# Patient Record
Sex: Female | Born: 1955 | Race: Black or African American | Marital: Single | Smoking: Never smoker
Health system: Southern US, Community
[De-identification: ages and names within clinical notes are randomized; demographics above are authoritative.]

## PROBLEM LIST (undated history)

## (undated) DIAGNOSIS — I10 Essential (primary) hypertension: Secondary | ICD-10-CM

## (undated) DIAGNOSIS — I639 Cerebral infarction, unspecified: Secondary | ICD-10-CM

---

## 2017-10-03 ENCOUNTER — Ambulatory Visit (INDEPENDENT_AMBULATORY_CARE_PROVIDER_SITE_OTHER): Payer: BLUE CROSS/BLUE SHIELD | Admitting: Advanced Practice Midwife

## 2017-10-03 ENCOUNTER — Encounter: Payer: Self-pay | Admitting: Advanced Practice Midwife

## 2017-10-03 VITALS — BP 135/97 | HR 74 | Ht 62.0 in | Wt 124.1 lb

## 2017-10-03 DIAGNOSIS — L918 Other hypertrophic disorders of the skin: Secondary | ICD-10-CM

## 2017-10-03 DIAGNOSIS — Z01419 Encounter for gynecological examination (general) (routine) without abnormal findings: Secondary | ICD-10-CM

## 2017-10-03 DIAGNOSIS — Z780 Asymptomatic menopausal state: Secondary | ICD-10-CM | POA: Insufficient documentation

## 2017-10-03 DIAGNOSIS — N951 Menopausal and female climacteric states: Secondary | ICD-10-CM

## 2017-10-03 NOTE — Progress Notes (Signed)
GYNECOLOGY ANNUAL PREVENTATIVE CARE ENCOUNTER NOTE  Subjective:   Ann Schmidt is a 62 y.o. [id number] female here for a routine annual gynecologic exam.  Current complaints: none.  Wants physical exam form completed so she can foster to adopt.  Plans to adopt preteen brothers. Is healthy and does weight bearing exercise to keep bones strong.  Has some night sweats still.   No daytime hot flashes.  Unmarried and abstinent.  Happy with her life.  Works as an Acupuncturistelectrical engineer.  .   Denies abnormal vaginal bleeding, discharge, pelvic pain, or other gynecologic concerns.    Gynecologic History No LMP recorded. Contraception: menopausal Last Pap: 2018. Results were: normal Last mammogram: last year. Results were: normal Had colonoscopy 8 years ago  Obstetric History OB History  Gravida Para Term Preterm AB Living  3       3    SAB TAB Ectopic Multiple Live Births    3          # Outcome Date GA Lbr Len/2nd Weight Sex Delivery Anes PTL Lv  3 TAB 1985          2 TAB 1982          1 TAB 1980            History reviewed. No pertinent past medical history.  History reviewed. No pertinent surgical history.  Current Outpatient Medications on File Prior to Visit  Medication Sig Dispense Refill  . amoxicillin (AMOXIL) 500 MG capsule Take by mouth.     No current facility-administered medications on file prior to visit.     Allergies  Allergen Reactions  . Sulfa Antibiotics Hives  . Sulfamethoxazole-Trimethoprim Hives    Social History   Socioeconomic History  . Marital status: Single    Spouse name: Not on file  . Number of children: Not on file  . Years of education: Not on file  . Highest education level: Not on file  Occupational History  . Not on file  Social Needs  . Financial resource strain: Not on file  . Food insecurity:    Worry: Not on file    Inability: Not on file  . Transportation needs:    Medical: Not on file    Non-medical: Not on file  Tobacco Use  .  Smoking status: Never Smoker  . Smokeless tobacco: Never Used  Substance and Sexual Activity  . Alcohol use: Yes    Alcohol/week: 1.2 oz    Types: 2 Glasses of wine per week  . Drug use: Never  . Sexual activity: Not Currently    Birth control/protection: None  Lifestyle  . Physical activity:    Days per week: Not on file    Minutes per session: Not on file  . Stress: Not on file  Relationships  . Social connections:    Talks on phone: Not on file    Gets together: Not on file    Attends religious service: Not on file    Active member of club or organization: Not on file    Attends meetings of clubs or organizations: Not on file    Relationship status: Not on file  . Intimate partner violence:    Fear of current or ex partner: Not on file    Emotionally abused: Not on file    Physically abused: Not on file    Forced sexual activity: Not on file  Other Topics Concern  . Not on file  Social History Narrative  .  Not on file    Family History  Problem Relation Age of Onset  . Diabetes Mother     The following portions of the patient's history were reviewed and updated as appropriate: allergies, current medications, past family history, past medical history, past social history, past surgical history and problem list.  Review of Systems Pertinent items are noted in HPI.  Has small skin tags on neck   Objective:  BP (!) 135/97   Pulse 74   Ht 5\' 2"  (1.575 m)   Wt 124 lb 1.3 oz (56.3 kg)   BMI 22.69 kg/m  CONSTITUTIONAL: Well-developed, well-nourished female in no acute distress.  HENT:  Normocephalic, atraumatic, External right and left ear normal. Oropharynx is clear and moist  Tiny skin tags on neck EYES: Conjunctivae and EOM are normal.  No scleral icterus.  NECK: Normal range of motion, supple, no masses.  Normal thyroid.  SKIN: Skin is warm and dry. No rash noted. Not diaphoretic. No erythema. No pallor. NEUROLOGIC: Alert and oriented to person, place, and time.  Normal reflexes, muscle tone coordination. No cranial nerve deficit noted. PSYCHIATRIC: Normal mood and affect. Normal behavior. Normal judgment and thought content. CARDIOVASCULAR: Normal heart rate noted, regular rhythm RESPIRATORY: Clear to auscultation bilaterally. Effort and breath sounds normal, no problems with respiration noted. BREASTS: Symmetric in size. No masses, skin changes, nipple drainage, or lymphadenopathy. ABDOMEN: Soft, normal bowel sounds, no distention noted.  No tenderness, rebound or guarding.  PELVIC: Normal appearing external genitalia; normal appearing vaginal mucosa and cervix.  No abnormal discharge noted.  Pap smear not obtained, but I did use speculum to examine internal vagina and cervix.  Normal uterine size, no other palpable masses, no uterine or adnexal tenderness.    MUSCULOSKELETAL: Normal range of motion. No tenderness.  No cyanosis, clubbing, or edema.  2+ distal pulses.   Assessment:  Annual gynecologic examination without pap smear Menopausal with only night sweats Normal exam findings   Plan:  Mammogram not scheduled, patient will schedule Routine preventative health maintenance measures emphasized. Please refer to After Visit Summary for other counseling recommendations.

## 2017-10-03 NOTE — Patient Instructions (Signed)
Health Maintenance for Postmenopausal Women Menopause is a normal process in which your reproductive ability comes to an end. This process happens gradually over a span of months to years, usually between the ages of 22 and 9. Menopause is complete when you have missed 12 consecutive menstrual periods. It is important to talk with your health care provider about some of the most common conditions that affect postmenopausal women, such as heart disease, cancer, and bone loss (osteoporosis). Adopting a healthy lifestyle and getting preventive care can help to promote your health and wellness. Those actions can also lower your chances of developing some of these common conditions. What should I know about menopause? During menopause, you may experience a number of symptoms, such as:  Moderate-to-severe hot flashes.  Night sweats.  Decrease in sex drive.  Mood swings.  Headaches.  Tiredness.  Irritability.  Memory problems.  Insomnia.  Choosing to treat or not to treat menopausal changes is an individual decision that you make with your health care provider. What should I know about hormone replacement therapy and supplements? Hormone therapy products are effective for treating symptoms that are associated with menopause, such as hot flashes and night sweats. Hormone replacement carries certain risks, especially as you become older. If you are thinking about using estrogen or estrogen with progestin treatments, discuss the benefits and risks with your health care provider. What should I know about heart disease and stroke? Heart disease, heart attack, and stroke become more likely as you age. This may be due, in part, to the hormonal changes that your body experiences during menopause. These can affect how your body processes dietary fats, triglycerides, and cholesterol. Heart attack and stroke are both medical emergencies. There are many things that you can do to help prevent heart disease  and stroke:  Have your blood pressure checked at least every 1-2 years. High blood pressure causes heart disease and increases the risk of stroke.  If you are 53-22 years old, ask your health care provider if you should take aspirin to prevent a heart attack or a stroke.  Do not use any tobacco products, including cigarettes, chewing tobacco, or electronic cigarettes. If you need help quitting, ask your health care provider.  It is important to eat a healthy diet and maintain a healthy weight. ? Be sure to include plenty of vegetables, fruits, low-fat dairy products, and lean protein. ? Avoid eating foods that are high in solid fats, added sugars, or salt (sodium).  Get regular exercise. This is one of the most important things that you can do for your health. ? Try to exercise for at least 150 minutes each week. The type of exercise that you do should increase your heart rate and make you sweat. This is known as moderate-intensity exercise. ? Try to do strengthening exercises at least twice each week. Do these in addition to the moderate-intensity exercise.  Know your numbers.Ask your health care provider to check your cholesterol and your blood glucose. Continue to have your blood tested as directed by your health care provider.  What should I know about cancer screening? There are several types of cancer. Take the following steps to reduce your risk and to catch any cancer development as early as possible. Breast Cancer  Practice breast self-awareness. ? This means understanding how your breasts normally appear and feel. ? It also means doing regular breast self-exams. Let your health care provider know about any changes, no matter how small.  If you are 40  or older, have a clinician do a breast exam (clinical breast exam or CBE) every year. Depending on your age, family history, and medical history, it may be recommended that you also have a yearly breast X-ray (mammogram).  If you  have a family history of breast cancer, talk with your health care provider about genetic screening.  If you are at high risk for breast cancer, talk with your health care provider about having an MRI and a mammogram every year.  Breast cancer (BRCA) gene test is recommended for women who have family members with BRCA-related cancers. Results of the assessment will determine the need for genetic counseling and BRCA1 and for BRCA2 testing. BRCA-related cancers include these types: ? Breast. This occurs in males or females. ? Ovarian. ? Tubal. This may also be called fallopian tube cancer. ? Cancer of the abdominal or pelvic lining (peritoneal cancer). ? Prostate. ? Pancreatic.  Cervical, Uterine, and Ovarian Cancer Your health care provider may recommend that you be screened regularly for cancer of the pelvic organs. These include your ovaries, uterus, and vagina. This screening involves a pelvic exam, which includes checking for microscopic changes to the surface of your cervix (Pap test).  For women ages 21-65, health care providers may recommend a pelvic exam and a Pap test every three years. For women ages 79-65, they may recommend the Pap test and pelvic exam, combined with testing for human papilloma virus (HPV), every five years. Some types of HPV increase your risk of cervical cancer. Testing for HPV may also be done on women of any age who have unclear Pap test results.  Other health care providers may not recommend any screening for nonpregnant women who are considered low risk for pelvic cancer and have no symptoms. Ask your health care provider if a screening pelvic exam is right for you.  If you have had past treatment for cervical cancer or a condition that could lead to cancer, you need Pap tests and screening for cancer for at least 20 years after your treatment. If Pap tests have been discontinued for you, your risk factors (such as having a new sexual partner) need to be  reassessed to determine if you should start having screenings again. Some women have medical problems that increase the chance of getting cervical cancer. In these cases, your health care provider may recommend that you have screening and Pap tests more often.  If you have a family history of uterine cancer or ovarian cancer, talk with your health care provider about genetic screening.  If you have vaginal bleeding after reaching menopause, tell your health care provider.  There are currently no reliable tests available to screen for ovarian cancer.  Lung Cancer Lung cancer screening is recommended for adults 69-62 years old who are at high risk for lung cancer because of a history of smoking. A yearly low-dose CT scan of the lungs is recommended if you:  Currently smoke.  Have a history of at least 30 pack-years of smoking and you currently smoke or have quit within the past 15 years. A pack-year is smoking an average of one pack of cigarettes per day for one year.  Yearly screening should:  Continue until it has been 15 years since you quit.  Stop if you develop a health problem that would prevent you from having lung cancer treatment.  Colorectal Cancer  This type of cancer can be detected and can often be prevented.  Routine colorectal cancer screening usually begins at  age 42 and continues through age 45.  If you have risk factors for colon cancer, your health care provider may recommend that you be screened at an earlier age.  If you have a family history of colorectal cancer, talk with your health care provider about genetic screening.  Your health care provider may also recommend using home test kits to check for hidden blood in your stool.  A small camera at the end of a tube can be used to examine your colon directly (sigmoidoscopy or colonoscopy). This is done to check for the earliest forms of colorectal cancer.  Direct examination of the colon should be repeated every  5-10 years until age 71. However, if early forms of precancerous polyps or small growths are found or if you have a family history or genetic risk for colorectal cancer, you may need to be screened more often.  Skin Cancer  Check your skin from head to toe regularly.  Monitor any moles. Be sure to tell your health care provider: ? About any new moles or changes in moles, especially if there is a change in a mole's shape or color. ? If you have a mole that is larger than the size of a pencil eraser.  If any of your family members has a history of skin cancer, especially at a young age, talk with your health care provider about genetic screening.  Always use sunscreen. Apply sunscreen liberally and repeatedly throughout the day.  Whenever you are outside, protect yourself by wearing long sleeves, pants, a wide-brimmed hat, and sunglasses.  What should I know about osteoporosis? Osteoporosis is a condition in which bone destruction happens more quickly than new bone creation. After menopause, you may be at an increased risk for osteoporosis. To help prevent osteoporosis or the bone fractures that can happen because of osteoporosis, the following is recommended:  If you are 46-71 years old, get at least 1,000 mg of calcium and at least 600 mg of vitamin D per day.  If you are older than age 55 but younger than age 65, get at least 1,200 mg of calcium and at least 600 mg of vitamin D per day.  If you are older than age 54, get at least 1,200 mg of calcium and at least 800 mg of vitamin D per day.  Smoking and excessive alcohol intake increase the risk of osteoporosis. Eat foods that are rich in calcium and vitamin D, and do weight-bearing exercises several times each week as directed by your health care provider. What should I know about how menopause affects my mental health? Depression may occur at any age, but it is more common as you become older. Common symptoms of depression  include:  Low or sad mood.  Changes in sleep patterns.  Changes in appetite or eating patterns.  Feeling an overall lack of motivation or enjoyment of activities that you previously enjoyed.  Frequent crying spells.  Talk with your health care provider if you think that you are experiencing depression. What should I know about immunizations? It is important that you get and maintain your immunizations. These include:  Tetanus, diphtheria, and pertussis (Tdap) booster vaccine.  Influenza every year before the flu season begins.  Pneumonia vaccine.  Shingles vaccine.  Your health care provider may also recommend other immunizations. This information is not intended to replace advice given to you by your health care provider. Make sure you discuss any questions you have with your health care provider. Document Released: 04/22/2005  Document Revised: 09/18/2015 Document Reviewed: 12/02/2014 Elsevier Interactive Patient Education  2018 [employer]  

## 2019-05-24 ENCOUNTER — Ambulatory Visit: Payer: BLUE CROSS/BLUE SHIELD | Admitting: Obstetrics & Gynecology

## 2019-07-01 ENCOUNTER — Ambulatory Visit: Payer: Self-pay | Admitting: Obstetrics & Gynecology

## 2019-10-16 ENCOUNTER — Ambulatory Visit (INDEPENDENT_AMBULATORY_CARE_PROVIDER_SITE_OTHER): Payer: 59 | Admitting: Obstetrics & Gynecology

## 2019-10-16 ENCOUNTER — Encounter: Payer: Self-pay | Admitting: Obstetrics & Gynecology

## 2019-10-16 ENCOUNTER — Other Ambulatory Visit: Payer: Self-pay

## 2019-10-16 VITALS — BP 149/108 | HR 91 | Ht 62.0 in | Wt 121.0 lb

## 2019-10-16 DIAGNOSIS — Z1231 Encounter for screening mammogram for malignant neoplasm of breast: Secondary | ICD-10-CM

## 2019-10-16 DIAGNOSIS — Z01419 Encounter for gynecological examination (general) (routine) without abnormal findings: Secondary | ICD-10-CM | POA: Diagnosis not present

## 2019-10-16 DIAGNOSIS — B354 Tinea corporis: Secondary | ICD-10-CM | POA: Diagnosis not present

## 2019-10-16 DIAGNOSIS — Z538 Procedure and treatment not carried out for other reasons: Secondary | ICD-10-CM | POA: Diagnosis not present

## 2019-10-16 MED ORDER — MICONAZOLE NITRATE 2 % EX CREA: 1.0000 "application " | TOPICAL_CREAM | Freq: Two times a day (BID) | CUTANEOUS | 3 refills | Status: DC

## 2019-10-16 NOTE — Progress Notes (Signed)
Subjective:     Ann Schmidt is a 64 y.o. female here for a routine exam.  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 77 years old. Does not want PAP. Colonoscopy 2018 in [place].     Gynecologic History No LMP recorded. Patient is postmenopausal. Contraception: abstinence Last Pap: >3 years prev. Declines Pap today Last mammogram: Pt does not get annual mammograms as she reports not having any breast issues.   Obstetric History OB History  Gravida Para Term Preterm AB Living  3       3    SAB TAB Ectopic Multiple Live Births    3          # Outcome Date GA Lbr Len/2nd Weight Sex Delivery Anes PTL Lv  3 TAB 1985          2 TAB 1982          1 TAB 1980           The following portions of the patient's history were reviewed and updated as appropriate: allergies, current medications, past family history, past medical history, past social history, past surgical history and problem list.  Review of Systems Pertinent items are noted in HPI.    Objective:  BP (!) 149/108    Pulse 91    Ht 5\' 2"  (1.575 m)    Wt 121 lb (54.9 kg)    BMI 22.13 kg/m  General Appearance:    Alert, cooperative, no distress, appears stated age  Head:    Normocephalic, without obvious abnormality, atraumatic  Eyes:    conjunctiva/corneas clear, EOM's intact, both eyes  Ears:    Normal external ear canals, both ears  Nose:   Nares normal, septum midline, mucosa normal, no drainage    or sinus tenderness  Throat:   Lips, mucosa, and tongue normal; teeth and gums normal  Neck:   Supple, symmetrical, trachea midline, no adenopathy;    thyroid:  no enlargement/tenderness/nodules  Back:     Symmetric, no curvature, ROM normal, no CVA tenderness  Lungs:     respirations unlabored  Chest Wall:    No tenderness or deformity   Heart:    Regular rate and rhythm  Breast Exam:    No tenderness, masses, or nipple abnormality; yeast noted under breast.    Abdomen:      Soft, non-tender, bowel sounds active all four quadrants,    no masses, no organomegaly  Genitalia:    Normal female without lesion, discharge or tenderness     Extremities:   Extremities normal, atraumatic, no cyanosis or edema  Pulses:   2+ and symmetric all extremities  Skin:   Skin color, texture, turgor normal, no rashes or lesions     Assessment:    Healthy female exam.   Tinea under breasts.  Pt declines PAP. Agreed to get mammogram after counseling.   Plan:     Ann Schmidt was seen today for gynecologic exam.  Diagnoses and all orders for this visit:  Mammogram not needed  Breast cancer screening by mammogram -     MM DIGITAL SCREENING BILATERAL; Future  Tinea corporis -     miconazole (MICATIN) 2 % cream; Apply 1 application topically 2 (two) times daily.  f/u in 1 year or sooner prn  Ann Schmidt, M.D., [facility]

## 2019-11-30 ENCOUNTER — Emergency Department (HOSPITAL_BASED_OUTPATIENT_CLINIC_OR_DEPARTMENT_OTHER): Payer: 59

## 2019-11-30 ENCOUNTER — Inpatient Hospital Stay (HOSPITAL_BASED_OUTPATIENT_CLINIC_OR_DEPARTMENT_OTHER)
Admission: EM | Admit: 2019-11-30 | Discharge: 2019-12-02 | DRG: 065 | Disposition: A | Discharge status: Home Health | Payer: 59 | Attending: Internal Medicine | Admitting: Internal Medicine

## 2019-11-30 ENCOUNTER — Other Ambulatory Visit: Payer: Self-pay

## 2019-11-30 ENCOUNTER — Encounter (HOSPITAL_BASED_OUTPATIENT_CLINIC_OR_DEPARTMENT_OTHER): Payer: Self-pay | Admitting: Emergency Medicine

## 2019-11-30 DIAGNOSIS — Z882 Allergy status to sulfonamides status: Secondary | ICD-10-CM

## 2019-11-30 DIAGNOSIS — R29701 NIHSS score 1: Secondary | ICD-10-CM | POA: Diagnosis present

## 2019-11-30 DIAGNOSIS — R2990 Unspecified symptoms and signs involving the nervous system: Secondary | ICD-10-CM | POA: Insufficient documentation

## 2019-11-30 DIAGNOSIS — R200 Anesthesia of skin: Secondary | ICD-10-CM | POA: Diagnosis not present

## 2019-11-30 DIAGNOSIS — E785 Hyperlipidemia, unspecified: Secondary | ICD-10-CM | POA: Diagnosis present

## 2019-11-30 DIAGNOSIS — R202 Paresthesia of skin: Secondary | ICD-10-CM

## 2019-11-30 DIAGNOSIS — Z833 Family history of diabetes mellitus: Secondary | ICD-10-CM

## 2019-11-30 DIAGNOSIS — G8191 Hemiplegia, unspecified affecting right dominant side: Secondary | ICD-10-CM | POA: Diagnosis present

## 2019-11-30 DIAGNOSIS — I639 Cerebral infarction, unspecified: Secondary | ICD-10-CM

## 2019-11-30 DIAGNOSIS — Z20822 Contact with and (suspected) exposure to covid-19: Secondary | ICD-10-CM | POA: Diagnosis present

## 2019-11-30 DIAGNOSIS — I6381 Other cerebral infarction due to occlusion or stenosis of small artery: Principal | ICD-10-CM | POA: Diagnosis present

## 2019-11-30 DIAGNOSIS — I10 Essential (primary) hypertension: Secondary | ICD-10-CM

## 2019-11-30 DIAGNOSIS — R7303 Prediabetes: Secondary | ICD-10-CM | POA: Diagnosis present

## 2019-11-30 LAB — URINALYSIS, ROUTINE W REFLEX MICROSCOPIC
Bilirubin Urine: NEGATIVE
Glucose, UA: NEGATIVE mg/dL
Hgb urine dipstick: NEGATIVE
Ketones, ur: NEGATIVE mg/dL
Nitrite: NEGATIVE
Protein, ur: NEGATIVE mg/dL
Specific Gravity, Urine: <1.005 — ABNORMAL LOW (ref 1.005–1.030)
pH: 7 (ref 5.0–8.0)

## 2019-11-30 LAB — URINALYSIS, MICROSCOPIC (REFLEX)

## 2019-11-30 LAB — COMPREHENSIVE METABOLIC PANEL
ALT: 22 U/L (ref 0–44)
AST: 26 U/L (ref 15–41)
Albumin: 4.2 g/dL (ref 3.5–5.0)
Alkaline Phosphatase: 61 U/L (ref 38–126)
Anion gap: 12 (ref 5–15)
BUN: 18 mg/dL (ref 8–23)
CO2: 25 mmol/L (ref 22–32)
Calcium: 10.3 mg/dL (ref 8.9–10.3)
Chloride: 100 mmol/L (ref 98–111)
Creatinine, Ser: 0.95 mg/dL (ref 0.44–1.00)
GFR calc Af Amer: >60 mL/min (ref >60)
GFR calc non Af Amer: >60 mL/min (ref >60)
Glucose, Bld: 171 mg/dL — ABNORMAL HIGH (ref 70–99)
Potassium: 3.5 mmol/L (ref 3.5–5.1)
Sodium: 137 mmol/L (ref 135–145)
Total Bilirubin: 0.6 mg/dL (ref 0.3–1.2)
Total Protein: 7.5 g/dL (ref 6.5–8.1)

## 2019-11-30 LAB — RAPID URINE DRUG SCREEN, HOSP PERFORMED
Amphetamines: NOT DETECTED
Barbiturates: NOT DETECTED
Benzodiazepines: NOT DETECTED
Cocaine: NOT DETECTED
Opiates: NOT DETECTED
Tetrahydrocannabinol: NOT DETECTED

## 2019-11-30 LAB — CBC
HCT: 41.6 % (ref 36.0–46.0)
Hemoglobin: 14 g/dL (ref 12.0–15.0)
MCH: 30.8 pg (ref 26.0–34.0)
MCHC: 33.7 g/dL (ref 30.0–36.0)
MCV: 91.4 fL (ref 80.0–100.0)
Platelets: 205 10*3/uL (ref 150–400)
RBC: 4.55 MIL/uL (ref 3.87–5.11)
RDW: 12.7 % (ref 11.5–15.5)
WBC: 6.3 10*3/uL (ref 4.0–10.5)
nRBC: 0 % (ref 0.0–0.2)

## 2019-11-30 LAB — DIFFERENTIAL
Abs Immature Granulocytes: 0.02 10*3/uL (ref 0.00–0.07)
Basophils Absolute: 0 10*3/uL (ref 0.0–0.1)
Basophils Relative: 1 %
Eosinophils Absolute: 0.1 10*3/uL (ref 0.0–0.5)
Eosinophils Relative: 1 %
Immature Granulocytes: 0 %
Lymphocytes Relative: 39 %
Lymphs Abs: 2.5 10*3/uL (ref 0.7–4.0)
Monocytes Absolute: 0.6 10*3/uL (ref 0.1–1.0)
Monocytes Relative: 9 %
Neutro Abs: 3.1 10*3/uL (ref 1.7–7.7)
Neutrophils Relative %: 50 %

## 2019-11-30 LAB — ETHANOL: Alcohol, Ethyl (B): <10 mg/dL (ref <10)

## 2019-11-30 LAB — CBG MONITORING, ED: Glucose-Capillary: 156 mg/dL — ABNORMAL HIGH (ref 70–99)

## 2019-11-30 LAB — PROTIME-INR
INR: 1 (ref 0.8–1.2)
Prothrombin Time: 12.5 seconds (ref 11.4–15.2)

## 2019-11-30 LAB — APTT: aPTT: 31 seconds (ref 24–36)

## 2019-11-30 LAB — SARS CORONAVIRUS 2 BY RT PCR (HOSPITAL ORDER, PERFORMED IN ~~LOC~~ HOSPITAL LAB): SARS Coronavirus 2: NEGATIVE

## 2019-11-30 NOTE — ED Notes (Signed)
ED Provider at bedside. 

## 2019-11-30 NOTE — ED Triage Notes (Signed)
R arm and leg numbness since 18:30 yesterday.

## 2019-11-30 NOTE — ED Provider Notes (Signed)
Emergency Department Provider Note   I have reviewed the triage vital signs and the nursing notes.   HISTORY  Chief Complaint Numbness   HPI Ann Schmidt is a 64 y.o. female with PMH reviewed below presents to the emergency department with right arm and leg numbness with weakness.  Symptoms began at 18:30 yesterday.  Patient states she initially developed numbness feeling in the right foot but then noticed that it spread to her right arm and leg.  She denies any weakness or numbness in the face.  She is not experiencing headache.  No vision changes.  No speech changes.  No difficulty swallowing.  She states it was getting late yesterday and she did not want to come to the emergency department but this morning when symptoms persisted she decided to f/u in the ED and presents to the POV.   History reviewed. No pertinent past medical history.  Patient Active Problem List   Diagnosis Date Noted  . Stroke-like symptoms 11/30/2019  . Menopausal hot flushes 10/03/2017  . Menopause 10/03/2017    History reviewed. No pertinent surgical history.  Allergies Sulfa antibiotics and Sulfamethoxazole-trimethoprim  Family History  Problem Relation Age of Onset  . Diabetes Mother     Social History Social History   Tobacco Use  . Smoking status: Never Smoker  . Smokeless tobacco: Never Used  Substance Use Topics  . Alcohol use: Yes    Alcohol/week: 2.0 standard drinks    Types: 2 Glasses of wine per week  . Drug use: Never    Review of Systems  Constitutional: No fever/chills Eyes: No visual changes. ENT: No sore throat. Cardiovascular: Denies chest pain. Respiratory: Denies shortness of breath. Gastrointestinal: No abdominal pain.  No nausea, no vomiting.  No diarrhea.  No constipation. Genitourinary: Negative for dysuria. Musculoskeletal: Negative for back pain. Skin: Negative for rash. Neurological: Negative for headaches. Positive right arm/leg weakness and numbness.    10-point ROS otherwise negative.  ____________________________________________   PHYSICAL EXAM:  VITAL SIGNS: ED Triage Vitals [11/30/19 1815]  Enc Vitals Group     BP (!) 196/144     Pulse Rate (!) 136     Resp 20     Temp 99.8 F (37.7 C)     Temp Source Oral     SpO2 99 %   Constitutional: Alert and oriented. Well appearing and in no acute distress. Eyes: Conjunctivae are normal. Head: Atraumatic. Nose: No congestion/rhinnorhea. Mouth/Throat: Mucous membranes are moist.  Oropharynx non-erythematous. Neck: No stridor.  Cardiovascular: Sinus tachcyardia. Good peripheral circulation. Grossly normal heart sounds.   Respiratory: Normal respiratory effort.  No retractions. Lungs CTAB. Gastrointestinal: Soft and nontender. No distention.  Musculoskeletal: No lower extremity tenderness nor edema. No gross deformities of extremities. Neurologic:  Normal speech and language. No numbness/weakness in the right face. 4+/5 strength in the right arm and leg with decreased sensation to light touch in the right arm/leg as well.  Skin:  Skin is warm, dry and intact. No rash noted.   ____________________________________________   LABS (all labs ordered are listed, but only abnormal results are displayed)  Labs Reviewed  COMPREHENSIVE METABOLIC PANEL - Abnormal; Notable for the following components:      Result Value   Glucose, Bld 171 (*)    All other components within normal limits  URINALYSIS, ROUTINE W REFLEX MICROSCOPIC - Abnormal; Notable for the following components:   Specific Gravity, Urine <1.005 (*)    Leukocytes,Ua SMALL (*)    All other  components within normal limits  URINALYSIS, MICROSCOPIC (REFLEX) - Abnormal; Notable for the following components:   Bacteria, UA FEW (*)    All other components within normal limits  CBG MONITORING, ED - Abnormal; Notable for the following components:   Glucose-Capillary 156 (*)    All other components within normal limits  SARS  CORONAVIRUS 2 BY RT PCR (HOSPITAL ORDER, PERFORMED IN [facility])  ETHANOL  PROTIME-INR  APTT  CBC  DIFFERENTIAL  RAPID URINE DRUG SCREEN, HOSP PERFORMED   ____________________________________________  EKG   EKG Interpretation  Date/Time:  Saturday November 30 2019 18:33:20 EDT Ventricular Rate:  135 PR Interval:    QRS Duration: 82 QT Interval:  291 QTC Calculation: 437 R Axis:   -21 Text Interpretation: Sinus tachycardia LAE, consider biatrial enlargement Borderline left axis deviation Low voltage, precordial leads No STEMI Confirmed by Alona Bene [phone]) on 11/30/2019 6:49:53 PM       ____________________________________________  RADIOLOGY  CT Head Wo Contrast  Result Date: 11/30/2019 CLINICAL DATA:  Right-sided numbness since 07/30 last night EXAM: CT HEAD WITHOUT CONTRAST TECHNIQUE: Contiguous axial images were obtained from the base of the skull through the vertex without intravenous contrast. COMPARISON:  None. FINDINGS: Brain: No evidence of acute infarction, hemorrhage, hydrocephalus, extra-axial collection or mass lesion/mass effect. Mild periventricular and deep white matter hypodensity. Vascular: No hyperdense vessel or unexpected calcification. Skull: Normal. Negative for fracture or focal lesion. Sinuses/Orbits: No acute finding. Other: None. IMPRESSION: 1. No acute intracranial pathology. No non-contrast CT evidence of acute stroke or hemorrhage. 2.  Small-vessel white matter disease. Electronically Signed   By: Lauralyn Primes M.D.   On: 11/30/2019 19:10    ____________________________________________   PROCEDURES  Procedure(s) performed:   Procedures  None ____________________________________________   INITIAL IMPRESSION / ASSESSMENT AND PLAN / ED COURSE  Pertinent labs & imaging results that were available during my care of the patient were reviewed by me and considered in my medical decision making (see chart for details).   Patient  presents to the emergency department with strokelike symptoms since yesterday.  She is greater than 24 hours with symptoms and outside the window for TPA or intravascular retrieval.  No LVO concerns clinically.  Plan for CT Noncon labs.  She does have sinus tachycardia here of unclear significance.  Plan for monitoring and allow for permissive hypertension for now pending CT.   08:00 AM   spoke with Dr. Laurence Slate with Neurology.  He will see the patient in consult when they arrive to [facility].  He request that the hospitalist team make him aware when the patient has arrived.  CT imaging of the head with no acute findings.  Labs reviewed and plan discussed with patient who is in agreement.  Discussed patient's case with [facility] to request admission. Patient and family (if present) updated with plan. Care transferred to [facility] service.  I reviewed all nursing notes, vitals, pertinent old records, EKGs, labs, imaging (as available).  ____________________________________________  FINAL CLINICAL IMPRESSION(S) / ED DIAGNOSES  Final diagnoses:  Cerebrovascular accident (CVA), unspecified mechanism (HCC)     Note:  This document was prepared using Dragon voice recognition software and may include unintentional dictation errors.  Alona Bene, MD, [facility]    Indiya Izquierdo, Arlyss Repress, MD 12/01/19 1324

## 2019-11-30 NOTE — ED Notes (Signed)
Patient transported to CT 

## 2019-12-01 ENCOUNTER — Encounter (HOSPITAL_COMMUNITY): Payer: Self-pay | Admitting: Family Medicine

## 2019-12-01 ENCOUNTER — Inpatient Hospital Stay (HOSPITAL_COMMUNITY): Payer: 59

## 2019-12-01 DIAGNOSIS — I63332 Cerebral infarction due to thrombosis of left posterior cerebral artery: Secondary | ICD-10-CM | POA: Diagnosis not present

## 2019-12-01 DIAGNOSIS — I361 Nonrheumatic tricuspid (valve) insufficiency: Secondary | ICD-10-CM | POA: Diagnosis not present

## 2019-12-01 DIAGNOSIS — E785 Hyperlipidemia, unspecified: Secondary | ICD-10-CM | POA: Diagnosis present

## 2019-12-01 DIAGNOSIS — I10 Essential (primary) hypertension: Secondary | ICD-10-CM

## 2019-12-01 DIAGNOSIS — R202 Paresthesia of skin: Secondary | ICD-10-CM

## 2019-12-01 DIAGNOSIS — R200 Anesthesia of skin: Secondary | ICD-10-CM | POA: Diagnosis present

## 2019-12-01 DIAGNOSIS — Z833 Family history of diabetes mellitus: Secondary | ICD-10-CM | POA: Diagnosis not present

## 2019-12-01 DIAGNOSIS — I6381 Other cerebral infarction due to occlusion or stenosis of small artery: Secondary | ICD-10-CM | POA: Diagnosis present

## 2019-12-01 DIAGNOSIS — G8191 Hemiplegia, unspecified affecting right dominant side: Secondary | ICD-10-CM | POA: Diagnosis present

## 2019-12-01 DIAGNOSIS — R29701 NIHSS score 1: Secondary | ICD-10-CM | POA: Diagnosis present

## 2019-12-01 DIAGNOSIS — R7303 Prediabetes: Secondary | ICD-10-CM | POA: Diagnosis present

## 2019-12-01 DIAGNOSIS — I6389 Other cerebral infarction: Secondary | ICD-10-CM | POA: Diagnosis not present

## 2019-12-01 DIAGNOSIS — Z882 Allergy status to sulfonamides status: Secondary | ICD-10-CM | POA: Diagnosis not present

## 2019-12-01 DIAGNOSIS — I639 Cerebral infarction, unspecified: Secondary | ICD-10-CM | POA: Diagnosis not present

## 2019-12-01 DIAGNOSIS — Z20822 Contact with and (suspected) exposure to covid-19: Secondary | ICD-10-CM | POA: Diagnosis present

## 2019-12-01 MED ORDER — STROKE: EARLY STAGES OF RECOVERY BOOK
Freq: Once | Status: AC
  Filled 2019-12-01: qty 1

## 2019-12-01 MED ORDER — IRBESARTAN 150 MG PO TABS
150.0000 mg | ORAL_TABLET | Freq: Every day | ORAL | Status: DC
  Administered 2019-12-01: 150 mg via ORAL
  Filled 2019-12-01: qty 1

## 2019-12-01 MED ORDER — ACETAMINOPHEN 325 MG PO TABS: 650.0000 mg | ORAL_TABLET | ORAL | Status: DC | PRN

## 2019-12-01 MED ORDER — ASPIRIN 81 MG PO CHEW
324.0000 mg | CHEWABLE_TABLET | Freq: Once | ORAL | Status: AC
  Administered 2019-12-01: 324 mg via ORAL
  Filled 2019-12-01: qty 4

## 2019-12-01 MED ORDER — SODIUM CHLORIDE 0.9 % IV SOLN: INTRAVENOUS | Status: DC

## 2019-12-01 MED ORDER — ATORVASTATIN CALCIUM 80 MG PO TABS
80.0000 mg | ORAL_TABLET | Freq: Every day | ORAL | Status: DC
  Administered 2019-12-01: 80 mg via ORAL
  Filled 2019-12-01: qty 1

## 2019-12-01 MED ORDER — CLOPIDOGREL BISULFATE 75 MG PO TABS
75.0000 mg | ORAL_TABLET | Freq: Every day | ORAL | Status: DC
  Administered 2019-12-01 – 2019-12-02 (×2): 75 mg via ORAL
  Filled 2019-12-01 (×2): qty 1

## 2019-12-01 MED ORDER — ACETAMINOPHEN 160 MG/5ML PO SOLN: 650.0000 mg | ORAL | Status: DC | PRN

## 2019-12-01 MED ORDER — ACETAMINOPHEN 650 MG RE SUPP: 650.0000 mg | RECTAL | Status: DC | PRN

## 2019-12-01 MED ORDER — SENNOSIDES-DOCUSATE SODIUM 8.6-50 MG PO TABS: 1.0000 | ORAL_TABLET | Freq: Every evening | ORAL | Status: DC | PRN

## 2019-12-01 NOTE — Consult Note (Signed)
Requesting Physician: Dr. Rachael Darby    Chief Complaint:   History obtained from: Patient and Chart   HPI:                                                                                                                                       Ann Schmidt is a 64 y.o. female with no significant past medical history presents emergency department with symptoms of right-sided numbness in her right leg that began on Friday evening 6:30 PM.  She woke up on Saturday and noticed now she had weakness and numbness in her right arm as well as right leg with associated difficulty walking patient presented to the med [facility] emergency department and underwent CT head which showed no acute abnormality.  She was candidate for IV TPA she was outside the window.  Patient waited in the emergency department for greater than 24 hours waiting transfer to [facility] for further stroke evaluation.  On arrival patient feels she has improved with reduced numbness and improved strength. She states her blood pressure was high on physical and was supposed to follow-up with PCP, just recently made an appointment and yet to establish care.  Date last known well: 9 18-21 tPA Given: No outside TPA window NIHSS: 1 Baseline MRS 0  Past medical history -Hypertension  Family History  Problem Relation Age of Onset  . Diabetes Mother    Social History:  reports that she has never smoked. She has never used smokeless tobacco. She reports current alcohol use of about 2.0 standard drinks of alcohol per week. She reports that she does not use drugs.  Allergies:  Allergies  Allergen Reactions  . Sulfa Antibiotics Hives  . Sulfamethoxazole-Trimethoprim Hives    Medications:                                                                                                                        I reviewed home medications   ROS:  14 systems reviewed and negative except above    Examination:                                                                                                      General: Appears well-developed  Psych: Affect appropriate to situation Eyes: No scleral injection HENT: No OP obstrucion Head: Normocephalic.  Cardiovascular: Normal rate and regular rhythm.  Respiratory: Effort normal and breath sounds normal to anterior ascultation GI: Soft.  No distension. There is no tenderness.  Skin: WDI    Neurological Examination Mental Status: Alert, oriented, thought content appropriate.  Speech fluent without evidence of aphasia. Able to follow 3 step commands without difficulty. Cranial Nerves: II: Visual fields grossly normal,  III,IV, VI: ptosis not present, extra-ocular motions intact bilaterally, pupils equal, round, reactive to light and accommodation V,VII: smile symmetric, facial light touch sensation normal bilaterally VIII: hearing normal bilaterally IX,X: uvula rises symmetrically XI: bilateral shoulder shrug XII: midline tongue extension Motor: Right : Upper extremity   5/5    Left:     Upper extremity   5/5  Lower extremity   5/5     Lower extremity   5/5 Tone and bulk:normal tone throughout; no atrophy noted Sensory: Reduced sensation to light touch on the right arm and right leg Deep Tendon Reflexes:  symmetric throughout Plantars: Right: downgoing   Left: downgoing Cerebellar: normal finger-to-nose, normal rapid alternating movements and normal heel-to-shin test    Lab Results: Basic Metabolic Panel: Recent Labs  Lab 11/30/19 1847  NA 137  K 3.5  CL 100  CO2 25  GLUCOSE 171*  BUN 18  CREATININE 0.95  CALCIUM 10.3    CBC: Recent Labs  Lab 11/30/19 1847  WBC 6.3  NEUTROABS 3.1  HGB 14.0  HCT 41.6  MCV 91.4  PLT 205    Coagulation Studies: Recent Labs    11/30/19 1847  LABPROT 12.5  INR 1.0     Imaging: CT Head Wo Contrast  Result Date: 11/30/2019 CLINICAL DATA:  Right-sided numbness since 07/30 last night EXAM: CT HEAD WITHOUT CONTRAST TECHNIQUE: Contiguous axial images were obtained from the base of the skull through the vertex without intravenous contrast. COMPARISON:  None. FINDINGS: Brain: No evidence of acute infarction, hemorrhage, hydrocephalus, extra-axial collection or mass lesion/mass effect. Mild periventricular and deep white matter hypodensity. Vascular: No hyperdense vessel or unexpected calcification. Skull: Normal. Negative for fracture or focal lesion. Sinuses/Orbits: No acute finding. Other: None. IMPRESSION: 1. No acute intracranial pathology. No non-contrast CT evidence of acute stroke or hemorrhage. 2.  Small-vessel white matter disease. Electronically Signed   By: Lauralyn Primes M.D.   On: 11/30/2019 19:10     ASSESSMENT AND PLAN  64-year female with untreated blood pressure presents with sudden onset right-sided numbness due to left thalamic lacunar infarction.  Patient also has elevated LDL and borderline A1c.  Denies tobacco abuse and excessive alcohol abuse.   Acute Ischemic Stroke : Left thalamic infarct  Risk factors: Untreated hypertension, hyperlipidemia, prediabetes Etiology: Small vessel disease  Recommendations # MRI of the brain without  contrast: Completed and left thalamic infarct #MRA Head and neck : No acute findings #Transthoracic Echo : Pending # Start patient on aspirin 81 mg and Plavix 75 mg into 3 weeks followed by aspirin alone #Start or continue Atorvastatin 40 mg/other high intensity statin # BP goal: Gradually return to normotension # HBAIC and Lipid profile: 6 and LDL is 165 # Telemetry monitoring # Frequent neuro checks # NPO until passes stroke swallow screen  Please page stroke NP  Or  PA  Or MD from 8am -4 pm  as this patient from this time will be  followed by the stroke.   You can look them up on [email or web address]  Password  [facility]    Ann Schmidt [facility] Pager Number [phone]

## 2019-12-01 NOTE — ED Notes (Signed)
Report given to [facility] RN, receiving nurse at [facility]

## 2019-12-01 NOTE — ED Notes (Signed)
Pt updated regarding delay of beds at transfer site. NO beds avail at this time and no scheduled d/c. Pt verbalized understanding

## 2019-12-01 NOTE — ED Notes (Signed)
Updated EDP [place] regarding pt B/p trending up, EDP [place] advised b/p ok r/t stroke dx

## 2019-12-01 NOTE — ED Notes (Signed)
Reports numbness has improved some but still present.

## 2019-12-01 NOTE — H&P (Signed)
History and Physical    Ann Schmidt ZOX:[id number] DOB: 06/28/1955 DOA: 11/30/2019  PCP: Patient, No Pcp Per   Patient coming from:   Home  Chief Complaint: right leg numbness, weakness in right arm and leg.  HPI: Ann Schmidt is a 64 y.o. female with no past medical problems and does not take any medications on a regular basis.  [name] reports that she began to have symptoms of numbness in her right posterior lateral leg on Friday evening, days ago, around 6:30 PM.  She initially did not pay much attention to it and just walked it off she states.  When she woke up on Saturday she noticed that the numbness and weakness in her right leg was more profound and she had also developed numbness and weakness in her right foot and right arm.  He also developed difficulty walking and she could not bear weight on her right leg and would stagger when she tried to walk.  She initially thought it would just go away but when it did not in a few hours she decided to go to the emergency room for evaluation.  She did not have any visual change, slurred speech, drooping face, numbness of the face or difficulty swallowing.  She denies having any headache.  Reports she did not have any fever, chest pain, palpitations shortness of breath, cough.  States she has not had any recent illnesses.  Denies tobacco or illicit drug use.  She reports she drinks alcohol socially once every couple weekends.   ED Course: In the emergency room patient found to have an elevated blood pressure.  CT of her head was negative for acute hemorrhage or pathology.  Case was discussed with neurology recommended admission for further work-up and patient was can [facility] for further evaluation and neurology consultation.  The next 24 hours while she awaited transfer she states that her weakness improved in her right arm and right leg but she continues to have some numbness in the  right leg.  She is reports that now she is able to ambulate without staggering or limp.  She passed swallow screening the ER.   Review of Systems:  General: Reports weakness of right leg and arm. Denies fever, chills, weight loss, night sweats.  Denies dizziness.  Denies change in appetite HENT: Denies head trauma, headache, denies change in hearing, tinnitus.  Denies nasal congestion or bleeding.  Denies sore throat, sores in mouth.  Denies difficulty swallowing Eyes: Denies blurry vision, pain in eye, drainage.  Denies discoloration of eyes. Neck: Denies pain.  Denies swelling.  Denies pain with movement. Cardiovascular: Denies chest pain, palpitations.  Denies edema.  Denies orthopnea Respiratory: Denies shortness of breath, cough.  Denies wheezing.  Denies sputum production Gastrointestinal: Denies abdominal pain, swelling.  Denies nausea, vomiting, diarrhea.  Denies melena.  Denies hematemesis. Musculoskeletal: Denies limitation of movement.  Denies deformity or swelling.  Denies pain.  Denies arthralgias or myalgias. Genitourinary: Denies pelvic pain.  Denies urinary frequency or hesitancy.  Denies dysuria.  Skin: Denies rash.  Denies petechiae, purpura, ecchymosis. Neurological: Denies headache.  Denies syncope.  Denies seizure activity.  Reports weakness or paresthesia. Reports difficulty with ambulation which has improved over past 24 hours.  Denies slurred speech, drooping face.  Denies visual change. Psychiatric: Denies depression, anxiety.  Denies suicidal thoughts or ideation.  Denies hallucinations.  History reviewed. No pertinent past medical history.  History reviewed. No  pertinent surgical history.  Social History  reports that she has never smoked. She has never used smokeless tobacco. She reports current alcohol use of about 2.0 standard drinks of alcohol per week. She reports that she does not use drugs.  Allergies  Allergen Reactions  . Sulfa Antibiotics Hives  .  Sulfamethoxazole-Trimethoprim Hives    Family History  Problem Relation Age of Onset  . Diabetes Mother      Prior to Admission medications   Medication Sig Start Date End Date Taking? Authorizing Provider  amoxicillin (AMOXIL) 500 MG capsule Take by mouth. Patient not taking: Reported on 10/16/2019    [provider]  miconazole (MICATIN) 2 % cream Apply 1 application topically 2 (two) times daily. 10/16/19   Willodean Rosenthal, MD    Physical Exam: Vitals:   12/01/19 1530 12/01/19 1645 12/01/19 1735 12/01/19 1900  BP:  (!) 159/129 (!) 171/114 (!) 150/108  Pulse: 64 92 95 82  Resp: 19 18 (!) 21 17  Temp:    99.4 F (37.4 C)  TempSrc:    Oral  SpO2: 98% 100% 98% 98%  Weight:    54.1 kg  Height:    5\' 2"  (1.575 m)    Constitutional: NAD, calm, comfortable Vitals:   12/01/19 1530 12/01/19 1645 12/01/19 1735 12/01/19 1900  BP:  (!) 159/129 (!) 171/114 (!) 150/108  Pulse: 64 92 95 82  Resp: 19 18 (!) 21 17  Temp:    99.4 F (37.4 C)  TempSrc:    Oral  SpO2: 98% 100% 98% 98%  Weight:    54.1 kg  Height:    5\' 2"  (1.575 m)   General: WDWN, Alert and oriented x3.  Eyes: EOMI, PERRL, lids and conjunctivae normal.  Sclera nonicteric HENT:  [place]/AT, external ears normal.  Nares patent without epistasis.  Mucous membranes are moist. Posterior pharynx clear of any exudate or lesions. Normal dentition.  Neck: Soft, normal range of motion, supple, no masses, no thyromegaly.  Trachea midline Respiratory: clear to auscultation bilaterally, no wheezing, no crackles. Normal respiratory effort. No accessory muscle use.  Cardiovascular: Regular rate and rhythm, no murmurs / rubs / gallops. No extremity edema. 2+ pedal pulses. No carotid bruits.  Abdomen: Soft, no tenderness, nondistended, no rebound or guarding.  No masses palpated. No hepatosplenomegaly. Bowel sounds normoactive Musculoskeletal: FROM. no clubbing / cyanosis. No joint deformity upper and lower extremities.  Normal muscle tone.  Skin: Warm, dry, intact no rashes, lesions, ulcers. No induration Neurologic: CN 2-12 grossly intact.  Normal speech.  patella DTR +2 bilaterally. Strength 5/5 in all extremities. Has decreased sensation to touch of right lateral and posterior leg. No pronator drift.  Psychiatric: Normal judgment and insight.  Normal mood.    Labs on Admission: I have personally reviewed following labs and imaging studies  CBC: Recent Labs  Lab 11/30/19 1847  WBC 6.3  NEUTROABS 3.1  HGB 14.0  HCT 41.6  MCV 91.4  PLT 205    Basic Metabolic Panel: Recent Labs  Lab 11/30/19 1847  NA 137  K 3.5  CL 100  CO2 25  GLUCOSE 171*  BUN 18  CREATININE 0.95  CALCIUM 10.3    GFR: Estimated Creatinine Clearance: 48.6 mL/min (by C-G formula based on SCr of 0.95 mg/dL).  Liver Function Tests: Recent Labs  Lab 11/30/19 1847  AST 26  ALT 22  ALKPHOS 61  BILITOT 0.6  PROT 7.5  ALBUMIN 4.2    Urine analysis:  Component Value Date/Time   COLORURINE YELLOW 11/30/2019 2034   APPEARANCEUR CLEAR 11/30/2019 2034   LABSPEC <1.005 (L) 11/30/2019 2034   PHURINE 7.0 11/30/2019 2034   GLUCOSEU NEGATIVE 11/30/2019 2034   HGBUR NEGATIVE 11/30/2019 2034   BILIRUBINUR NEGATIVE 11/30/2019 2034   KETONESUR NEGATIVE 11/30/2019 2034   PROTEINUR NEGATIVE 11/30/2019 2034   NITRITE NEGATIVE 11/30/2019 2034   LEUKOCYTESUR SMALL (A) 11/30/2019 2034    Radiological Exams on Admission: CT Head Wo Contrast  Result Date: 11/30/2019 CLINICAL DATA:  Right-sided numbness since 07/30 last night EXAM: CT HEAD WITHOUT CONTRAST TECHNIQUE: Contiguous axial images were obtained from the base of the skull through the vertex without intravenous contrast. COMPARISON:  None. FINDINGS: Brain: No evidence of acute infarction, hemorrhage, hydrocephalus, extra-axial collection or mass lesion/mass effect. Mild periventricular and deep white matter hypodensity. Vascular: No hyperdense vessel or unexpected  calcification. Skull: Normal. Negative for fracture or focal lesion. Sinuses/Orbits: No acute finding. Other: None. IMPRESSION: 1. No acute intracranial pathology. No non-contrast CT evidence of acute stroke or hemorrhage. 2.  Small-vessel white matter disease. Electronically Signed   By: Lauralyn Primes M.D.   On: 11/30/2019 19:10    EKG: Independently reviewed.  EKG is reviewed.  EKG shows sinus tachycardia with left atrial enlargement by voltage criteria with a QTC of 437.  No acute ST elevation or depression.  Patient is now in normal sinus rhythm with heart rate in the 80s  Assessment/Plan Principal Problem:   Cerebrovascular accident (CVA) [facility]) Patient is admitted to medical telemetry floor for CVA.  Will obtain MRI of brain, MRA head and neck for further evaluation of large vessel occlusion.  Patient has had symptoms of numbness and weakness of right leg and arm for over 24 hours Obtain echocardiogram to evaluate for PFO, wall motion and ejection fraction. Patient has had elevated blood pressure for greater than 24 hours since symptom onset and will start to reduce blood pressure to goal levels.  Patient started on Avapro 150 mg p.o. daily.  Blood pressure Antiplatelet therapy with Plavix daily. Check lipid panel. Will start Lipitor for statin therapy as it has been shown in studies to reduce risk of recurrent strokes.   Neurochecks per stroke protocol Neurology is consulted and Dr. Laurence Slate to evaluate patient. Consult PT/OT/ST stroke protocol  Active Problems:   Essential hypertension Blood pressure has been elevated since arrival at the emergency room yesterday.  Patient started on Avapro 150 mg p.o. daily.  Monitor blood pressure    Paresthesia Continues to have some numbness in the right posterior and lateral leg.      DVT prophylaxis: Padua score low.  SCDs for DVT prophylaxis Code Status:   Full code Family Communication:  Diagnosis and plan discussed with patient.  Patient  verbalized understanding agrees with plan.  Questions were answered.  Further recommendation follow as clinical indicated Disposition Plan:   Patient is from:  [facility] via home  Anticipated DC to:  Home  Anticipated DC date:  Anticipate greater than 2 midnight stay in the hospital to treat treat acute medical condition  Anticipated DC barriers: No barriers to discharge identified at this time  Consults called:  Neurology, Dr. Laurence Slate Admission status:  Inpatient  Severity of Illness: The appropriate patient status for this patient is INPATIENT. Inpatient status is judged to be reasonable and necessary in order to provide the required intensity of service to ensure the patient's safety. The patient's presenting symptoms, physical exam findings, and  initial radiographic and laboratory data in the context of their chronic comorbidities is felt to place them at high risk for further clinical deterioration. Furthermore, it is not anticipated that the patient will be medically stable for discharge from the hospital within 2 midnights of admission. The following factors support the patient status of inpatient.   * I certify that at the point of admission it is my clinical judgment that the patient will require inpatient hospital care spanning beyond 2 midnights from the point of admission due to high intensity of service, high risk for further deterioration and high frequency of surveillance required.Claudean Severance Isaul Landi MD [facility]  How to contact the [facility] Attending or Consulting provider 7A - 7P or covering provider during after hours 7P -7A, for this patient?   1. Check the care team in [facility] and look for a) attending/consulting TR[facility] provider listed and b) the [facility] team listed 2. Log into [email or web address] and use [place]'s universal password to access. If you do not have the password, please contact the hospital operator. 3. Locate the [facility] provider you are looking for  under [facility] and page to a number that you can be directly reached. 4. If you still have difficulty reaching the provider, please page the [facility] (Director on Call) for the Hospitalists listed on amion for assistance.  12/01/2019, 7:54 PM

## 2019-12-01 NOTE — ED Notes (Signed)
Ambulatory in room, eating crackers. Gait steady, education given regarding HTN.

## 2019-12-01 NOTE — ED Notes (Signed)
Pt endorses "feeling better", reports no difference in legs, only slight difference between arms, slightly less sensation in right arm, grip is mildly less on right side.

## 2019-12-01 NOTE — ED Notes (Signed)
Pt given snack per request and verified with EDP [place]

## 2019-12-01 NOTE — ED Notes (Signed)
Report given to Chad RN with [facility]  

## 2019-12-02 ENCOUNTER — Inpatient Hospital Stay (HOSPITAL_COMMUNITY): Payer: 59

## 2019-12-02 DIAGNOSIS — I63332 Cerebral infarction due to thrombosis of left posterior cerebral artery: Secondary | ICD-10-CM

## 2019-12-02 DIAGNOSIS — I6389 Other cerebral infarction: Secondary | ICD-10-CM

## 2019-12-02 DIAGNOSIS — I361 Nonrheumatic tricuspid (valve) insufficiency: Secondary | ICD-10-CM

## 2019-12-02 DIAGNOSIS — I10 Essential (primary) hypertension: Secondary | ICD-10-CM

## 2019-12-02 LAB — ECHOCARDIOGRAM COMPLETE
Area-P 1/2: 2.62 cm2
Calc EF: 62.1 %
Height: 62 in
S' Lateral: 2.5 cm
Single Plane A2C EF: 66.2 %
Single Plane A4C EF: 59.9 %
Weight: 1908.3 oz

## 2019-12-02 LAB — BASIC METABOLIC PANEL
Anion gap: 10 (ref 5–15)
BUN: 14 mg/dL (ref 8–23)
CO2: 25 mmol/L (ref 22–32)
Calcium: 9.5 mg/dL (ref 8.9–10.3)
Chloride: 105 mmol/L (ref 98–111)
Creatinine, Ser: 0.98 mg/dL (ref 0.44–1.00)
GFR calc Af Amer: >60 mL/min (ref >60)
GFR calc non Af Amer: >60 mL/min (ref >60)
Glucose, Bld: 122 mg/dL — ABNORMAL HIGH (ref 70–99)
Potassium: 4 mmol/L (ref 3.5–5.1)
Sodium: 140 mmol/L (ref 135–145)

## 2019-12-02 LAB — LIPID PANEL
Cholesterol: 232 mg/dL — ABNORMAL HIGH (ref 0–200)
HDL: 43 mg/dL (ref >40)
LDL Cholesterol: 165 mg/dL — ABNORMAL HIGH (ref 0–99)
Total CHOL/HDL Ratio: 5.4 RATIO
Triglycerides: 119 mg/dL (ref <150)
VLDL: 24 mg/dL (ref 0–40)

## 2019-12-02 LAB — HEMOGLOBIN A1C
Hgb A1c MFr Bld: 6 % — ABNORMAL HIGH (ref 4.8–5.6)
Mean Plasma Glucose: 125.5 mg/dL

## 2019-12-02 LAB — CBC
HCT: 44.1 % (ref 36.0–46.0)
Hemoglobin: 14.7 g/dL (ref 12.0–15.0)
MCH: 31.3 pg (ref 26.0–34.0)
MCHC: 33.3 g/dL (ref 30.0–36.0)
MCV: 94 fL (ref 80.0–100.0)
Platelets: 233 10*3/uL (ref 150–400)
RBC: 4.69 MIL/uL (ref 3.87–5.11)
RDW: 13 % (ref 11.5–15.5)
WBC: 6.1 10*3/uL (ref 4.0–10.5)
nRBC: 0 % (ref 0.0–0.2)

## 2019-12-02 LAB — HIV ANTIBODY (ROUTINE TESTING W REFLEX): HIV Screen 4th Generation wRfx: NONREACTIVE

## 2019-12-02 MED ORDER — CLOPIDOGREL BISULFATE 75 MG PO TABS: 75.0000 mg | ORAL_TABLET | Freq: Every day | ORAL | 0 refills | Status: AC

## 2019-12-02 MED ORDER — ASPIRIN 81 MG PO TBEC
81.0000 mg | DELAYED_RELEASE_TABLET | Freq: Every day | ORAL | 2 refills | Status: DC
Start: 2019-12-03 — End: 2020-11-18

## 2019-12-02 MED ORDER — GADOBUTROL 1 MMOL/ML IV SOLN
5.5000 mL | Freq: Once | INTRAVENOUS | Status: AC | PRN
  Administered 2019-12-02: 5.5 mL via INTRAVENOUS

## 2019-12-02 MED ORDER — ATORVASTATIN CALCIUM 80 MG PO TABS
80.0000 mg | ORAL_TABLET | Freq: Every day | ORAL | 2 refills | Status: DC
Start: 2019-12-02 — End: 2020-01-08

## 2019-12-02 MED ORDER — ASPIRIN EC 81 MG PO TBEC
81.0000 mg | DELAYED_RELEASE_TABLET | Freq: Every day | ORAL | Status: DC
  Administered 2019-12-02: 81 mg via ORAL
  Filled 2019-12-02: qty 1

## 2019-12-02 MED ORDER — IRBESARTAN 75 MG PO TABS: 75.0000 mg | ORAL_TABLET | Freq: Every day | ORAL | 1 refills | Status: DC

## 2019-12-02 NOTE — Evaluation (Signed)
Occupational Therapy Evaluation Patient Details Name: Ann Schmidt MRN: [id number] DOB: 1955/10/08 Today's Date: 12/02/2019    History of Present Illness Pt is a 64 yo female admitted with L thalamic lacunar infarct. PMH significant for HTN.   Clinical Impression   PT admitted with CVA L thalamic lacunar infarct. Pt currently with functional limitiations due to the deficits listed below (see OT problem list). Pt with R UE / LE sensory and motor deficits affecting balance with all adls. Pt with decr awareness to deficits at this time.  Pt will benefit from skilled OT to increase their independence and safety with adls and balance to allow discharge HHOT .     Follow Up Recommendations  Home health OT (due to transportation concerns)    Equipment Recommendations  None recommended by OT    Recommendations for Other Services       Precautions / Restrictions Precautions Precautions: None Precaution Comments: "Low fall risk" Restrictions Weight Bearing Restrictions: No      Mobility Bed Mobility               General bed mobility comments: oob in chair  Transfers Overall transfer level: Modified independent Equipment used: None Transfers: Sit to/from Stand           General transfer comment: no (A) but requires L UE on arm rest    Balance Overall balance assessment: Needs assistance Sitting-balance support: Feet supported;No upper extremity supported Sitting balance-Leahy Scale: Good     Standing balance support: No upper extremity supported Standing balance-Leahy Scale: Fair                   Standardized Balance Assessment Standardized Balance Assessment : Dynamic Gait Index   Dynamic Gait Index Level Surface: Mild Impairment Change in Gait Speed: Mild Impairment Gait with Horizontal Head Turns: Mild Impairment Gait with Vertical Head Turns: Mild Impairment Gait and Pivot Turn: Normal Step Over Obstacle: Mild Impairment Step Around Obstacles:  Mild Impairment Steps: Mild Impairment Total Score: 17     ADL either performed or assessed with clinical judgement   ADL Overall ADL's : Needs assistance/impaired Eating/Feeding: Set up;Sitting   Grooming: Wash/dry face;Oral care;Set up;Standing Grooming Details (indicate cue type and reason): needed increased time and L hand use for opening containers due to R hand deficits                 Toilet Transfer: Supervision/safety           Functional mobility during ADLs: Supervision/safety (dragging R LE due to sensory changes and it feels "heavy" ) General ADL Comments: pt with R side weakness affecting balance and basic transfers. pt educated on hand exercises on phone and reaching with thumb up for objects to work toward normal grasp pattern.      Vision Baseline Vision/History: Wears glasses       Perception     Praxis      Pertinent Vitals/Pain Pain Assessment: No/denies pain     Hand Dominance Right   Extremity/Trunk Assessment Upper Extremity Assessment Upper Extremity Assessment: RUE deficits/detail RUE Deficits / Details: Mildly decreased strength compared to LUE - grossly 4/5 in shoulder flexion, biceps, triceps RUE Sensation: decreased light touch (triceps down to fingertips) RUE Coordination: decreased fine motor (finger to nose)   Lower Extremity Assessment Lower Extremity Assessment: RLE deficits/detail RLE Deficits / Details: Mildly decreased strength - grossly 4-/5 in hip flexors, quads, hamstrings, ankle DF RLE Sensation: decreased light touch RLE Coordination: decreased gross  motor   Cervical / Trunk Assessment Cervical / Trunk Assessment: Normal   Communication Communication Communication: No difficulties   Cognition Arousal/Alertness: Awake/alert Behavior During Therapy: WFL for tasks assessed/performed Overall Cognitive Status: Impaired/Different from baseline Area of Impairment: Safety/judgement;Problem solving;Memory;Awareness                      Memory:  (questionable memory)   Safety/Judgement: Decreased awareness of safety;Decreased awareness of deficits Awareness: Emergent Problem Solving: Requires verbal cues General Comments: pt was able to state saturday was 18 then sunday is 19th so today must be the 20th. 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 pt reports that she cant take time off from either job. pt states "i am going to give myself the week off because i am still here but thats it" pt needs education on safety with cooking by actual sensory testing with water at sink. pt states "oh that makes sense" pt then reports that she wont drive for one week because she might hit the brake and not gas but reports she will drive next week. Pt educated on sensory changes wil affect driving in one week. pt states "oh you think so?" pt reports voice tone, speed and production to be "normal" pt reports that this is so strange because when someone has a stroke they cant talk and she can very clearly talk. Attempted at education of the brain being injuried by the "stroke" has occurred and needing time to heal and pt states "its just my right arm and leg feeling tight" lack of awareness to the sensory and motor deficits now present   General Comments  increase risk for injury with cooking and educated on safety    Exercises     Shoulder Instructions      Home Living Family/patient expects to be discharged to:: Private residence Living Arrangements: Alone Available Help at Discharge: Friend(s);Available PRN/intermittently Type of Home: Apartment Home Access: Stairs to enter [employer] of Steps: 3 flights Entrance Stairs-Rails: Right;Left;Can reach both Home Layout: One level     Bathroom Shower/Tub: Chief Strategy Officer: Standard     Home Equipment: None   Additional Comments: Has church members that could check on her  upon d/c but no consistent support and seems to prefer not to have help. States she would rather call an Benedetto Goad than ask her church members for help.       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         OT Problem List: Decreased strength;Decreased activity tolerance;Impaired balance (sitting and/or standing);Decreased cognition;Decreased safety awareness;Decreased coordination;Decreased range of motion;Decreased knowledge of use of DME or AE;Decreased knowledge of precautions;Impaired tone;Impaired sensation;Impaired UE functional use      OT Treatment/Interventions: Self-care/ADL training;Therapeutic exercise;Neuromuscular education;Energy conservation;DME and/or AE instruction;Manual therapy;Modalities;Therapeutic activities;Patient/family education;Balance training;Cognitive remediation/compensation    OT Goals(Current goals can be found in the care plan section) Acute Rehab OT Goals Patient Stated Goal: Home ASAP OT Goal Formulation: With patient Time For Goal Achievement: 12/16/19 Potential to Achieve Goals: Good  OT Frequency: Min 3X/week   Barriers to D/C: Decreased caregiver support  lives alone       Co-evaluation  AM-PAC OT "6 Clicks" Daily Activity     Outcome Measure Help from another person eating meals?: A Little Help from another person taking care of personal grooming?: A Little Help from another person toileting, which includes using toliet, bedpan, or urinal?: A Little Help from another person bathing (including washing, rinsing, drying)?: A Little Help from another person to put on and taking off regular upper body clothing?: A Little Help from another person to put on and taking off regular lower body clothing?: A Little 6 Click Score: 18   End of Session Nurse  Communication: Mobility status;Precautions  Activity Tolerance: Patient tolerated treatment well Patient left: in chair;with call bell/phone within reach;with chair alarm set  OT Visit Diagnosis: Unsteadiness on feet (R26.81);Muscle weakness (generalized) (M62.81);Hemiplegia and hemiparesis Hemiplegia - Right/Left: Right Hemiplegia - dominant/non-dominant: Dominant Hemiplegia - caused by: Cerebral infarction                Time: 1062-6948 OT Time Calculation (min): 26 min Charges:  OT General Charges $OT Visit: 1 Visit OT Evaluation $OT Eval Moderate Complexity: 1 Mod OT Treatments $Self Care/Home Management : 8-22 mins   Brynn, OTR/L  Acute Rehabilitation Services Pager: [phone] Office: [phone] .   Mateo Flow 12/02/2019, 3:28 PM

## 2019-12-02 NOTE — Progress Notes (Signed)
SLP Cancellation Note  Patient Details Name: Ann Schmidt MRN: [id number] DOB: 1955-10-04   Cancelled treatment:       Reason Eval/Treat Not Completed: Patient at procedure or test/unavailable.  SLP will f/u as schedule allows.     Shanon Rosser Meaghan Whistler 12/02/2019, 8:34 AM

## 2019-12-02 NOTE — Discharge Instructions (Signed)
 Hospital Discharge After a Stroke  Being discharged from the hospital after a stroke can feel overwhelming. Many things may be different, and it is normal to feel scared or anxious. Some stroke survivors may be able to return to their homes, and others may need more specialized care on a temporary or permanent basis. Your stroke care team will work with you to develop a discharge plan that is best for you. Ask questions if you do not understand something. Invite a friend or family member to participate in discharge planning. Understanding and following your discharge plan can help to prevent another stroke or other problems. Understanding your medicines After a stroke, your health care provider may prescribe one or more types of medicine. It is important to take medicines exactly as told by your health care provider. Serious harm, such as another stroke, can happen if you are unable to take your medicine exactly as prescribed. Make sure you understand:  What medicine to take.  Why you are taking the medicine.  How and when to take it.  If it can be taken with your other medicines and herbal supplements.  Possible side effects.  When to call your health care provider if you have any side effects.  How you will get and pay for your medicines. Medical assistance programs may be able to help you pay for prescription medicines if you cannot afford them. If you are taking an anticoagulant, be sure to take it exactly as told by your health care provider. This type of medicine can increase the risk of bleeding because it works to prevent blood from clotting. You may need to take certain precautions to prevent bleeding. You should contact your health care provider if you have:  Bleeding or bruising.  A fall or other injury to your head.  Blood in your urine or stool (feces). Planning for home safety  Take steps to prevent falls, such as installing grab bars or using a shower chair. Ask a  friend or family member to get needed things in place before you go home if possible. A therapist can come to your home to make recommendations for safety equipment. Ask your health care provider if you would benefit from this service or from home care. Getting needed equipment Ask your health care provider for a list of any medical equipment and supplies you will need at home. These may include items such as:  Walkers.  Canes.  Wheelchairs.  Hand-strengthening devices.  Special eating utensils. Medical equipment can be rented or purchased, depending on your insurance coverage. Check with your insurance company about what is covered. Keeping follow-up visits After a stroke, you will need to follow up regularly with a health care provider. You may also need rehabilitation, which can include physical therapy, occupational therapy, or speech-language therapy. Keeping these appointments is very important to your recovery after a stroke. Be sure to bring your medicine list and discharge papers with you to your appointments. If you need help to keep track of your schedule, use a calendar or appointment reminder. Preventing another stroke Having a stroke puts you at risk for another stroke in the future. Ask your health care provider what actions you can take to lower the risk. These may include:  Increasing how much you exercise.  Making a healthy eating plan.  Quitting smoking.  Managing other health conditions, such as high blood pressure, high cholesterol, or diabetes.  Limiting alcohol use. Knowing the warning signs of a stroke  Make   sure you understand the signs of a stroke. Before you leave the hospital, you will receive information outlining the stroke warning signs. Share these with your friends and family members. "BE FAST" is an easy way to remember the main warning signs of a stroke:  B - Balance. Signs are dizziness, sudden trouble walking, or loss of balance.  E - Eyes.  Signs are trouble seeing or a sudden change in vision.  F - Face. Signs are sudden weakness or numbness of the face, or the face or eyelid drooping on one side.  A - Arms. Signs are weakness or numbness in an arm. This happens suddenly and usually on one side of the body.  S - Speech. Signs are sudden trouble speaking, slurred speech, or trouble understanding what people say.  T - Time. Time to call emergency services. Write down what time symptoms started. Other signs of stroke may include:  A sudden, severe headache with no known cause.  Nausea or vomiting.  Seizure. These symptoms may represent a serious problem that is an emergency. Do not wait to see if the symptoms will go away. Get medical help right away. Call your local emergency services (911 in the [place] Do not drive yourself to the hospital. Make note of the time that you had your first symptoms. Your emergency responders or emergency room staff will need to know this information. Summary  Being discharged from the hospital after a stroke can feel overwhelming. It is normal to feel scared or anxious.  Make sure you take medicines exactly as told by your health care provider.  Know the warning signs of a stroke, and get help right way if you have any of these symptoms. "BE FAST" is an easy way to remember the main warning signs of a stroke. This information is not intended to replace advice given to you by your health care provider. Make sure you discuss any questions you have with your health care provider. Document Revised: 11/21/2018 Document Reviewed: 06/03/2016 [employer] Patient Education  2020 [employer]  

## 2019-12-02 NOTE — Progress Notes (Signed)
PT Cancellation Note  Patient Details Name: Ann Schmidt MRN: [id number] DOB: 27-Dec-1955   Cancelled Treatment:    Reason Eval/Treat Not Completed: Patient at procedure or test/unavailable. Pt currently off unit for ECHO. Will check back as able to initiate PT evaluation.    Marylynn Pearson 12/02/2019, 8:38 AM   Conni Slipper, PT, DPT Acute Rehabilitation Services Pager: [phone] Office: [phone]

## 2019-12-02 NOTE — Discharge Summary (Signed)
[facility]  Physician Discharge Summary   Patient ID: Ann Schmidt MRN: [id number] DOB/AGE: 11/21/1955 64 y.o.  Admit date: 11/30/2019 Discharge date: 12/02/2019  PCP: Patient, No Pcp Per  DISCHARGE DIAGNOSES:  Acute left thalamic infarct secondary to small vessel disease Essential hypertension Hyperlipidemia  RECOMMENDATIONS FOR OUTPATIENT FOLLOW UP: 1. Follow-up with PCP for blood pressure management 2. Ambulatory referral to neurology    Home Health: PT and OT Equipment/Devices: None  CODE STATUS: Full code  DISCHARGE CONDITION: fair  Diet recommendation: Heart healthy  INITIAL HISTORY: 64 year old female with no past medical history who does not take any medications on a regular basis presented with numbness in her right arm and leg.  She was found to have an acute stroke.  She was hospitalized for further management.  Consultations:  Neurology   HOSPITAL COURSE:   Acute stroke Patient was hospitalized.  Stroke was confirmed on MRI. Patient was seen by neurology.  MRA of the head and neck was unremarkable.  Echocardiogram was also done which showed a EF of 60 to 65%.  LDL was 165.  HbA1c 6.0.  Urine drug screen was negative.  HIV nonreactive.  Patient does not take any antiplatelet agents.  She was started on aspirin and Plavix with plans to do dual antiplatelet treatment for 3 weeks followed by aspirin alone.  Patient was seen by physical and Occupational Therapy.  Home health will be ordered.  Ambulatory referral to neurology for follow-up.  Essential hypertension Patient found to have elevated blood pressure at presentation.  Permissive hypertension allowed.  She will be following up with her PCP for further management.  She will be discharged on Avapro.  Hyperlipidemia LDL is 165.  Started on Lipitor.  She was asked to cut back on her alcohol consumption.  Overall patient remained stable.  Okay for discharge home today.   PERTINENT LABS:  The  results of significant diagnostics from this hospitalization (including imaging, microbiology, ancillary and laboratory) are listed below for reference.    Microbiology: Recent Results (from the past 240 hour(s))  SARS Coronavirus 2 by RT PCR (hospital order, performed in [facility] hospital lab) Nasopharyngeal Nasopharyngeal Swab     Status: None   Collection Time: 11/30/19  6:48 PM   Specimen: Nasopharyngeal Swab  Result Value Ref Range Status   SARS Coronavirus 2 NEGATIVE NEGATIVE Final    Comment: (NOTE) SARS-CoV-2 target nucleic acids are NOT DETECTED.  The SARS-CoV-2 RNA is generally detectable in upper and lower respiratory specimens during the acute phase of infection. The lowest concentration of SARS-CoV-2 viral copies this assay can detect is 250 copies / mL. A negative result does not preclude SARS-CoV-2 infection and should not be used as the sole basis for treatment or other patient management decisions.  A negative result may occur with improper specimen collection / handling, submission of specimen other than nasopharyngeal swab, presence of viral mutation(s) within the areas targeted by this assay, and inadequate number of viral copies (<250 copies / mL). A negative result must be combined with clinical observations, patient history, and epidemiological information.  Fact Sheet for Patients:   BoilerBrush.com.cy  Fact Sheet for Healthcare Providers: https://pope.com/  This test is not yet approved or  cleared by the [place] FDA and has been authorized for detection and/or diagnosis of SARS-CoV-2 by FDA under an Emergency Use Authorization (EUA).  This EUA will remain in effect (meaning this test can be used) for the duration of the COVID-19 declaration under Section 564(b)(1)  of the Act, 21 U.S.C. section 360bbb-3(b)(1), unless the authorization is terminated or revoked sooner.  Performed at [facility], [address] [place], [place] [address]      Labs:   Basic Metabolic Panel: Recent Labs  Lab 11/30/19 1847 12/02/19 1107  NA 137 140  K 3.5 4.0  CL 100 105  CO2 25 25  GLUCOSE 171* 122*  BUN 18 14  CREATININE 0.95 0.98  CALCIUM 10.3 9.5   Liver Function Tests: Recent Labs  Lab 11/30/19 1847  AST 26  ALT 22  ALKPHOS 61  BILITOT 0.6  PROT 7.5  ALBUMIN 4.2   CBC: Recent Labs  Lab 11/30/19 1847 12/02/19 1107  WBC 6.3 6.1  NEUTROABS 3.1  --   HGB 14.0 14.7  HCT 41.6 44.1  MCV 91.4 94.0  PLT 205 233    CBG: Recent Labs  Lab 11/30/19 1836  GLUCAP 156*     IMAGING STUDIES CT Head Wo Contrast  Result Date: 11/30/2019 CLINICAL DATA:  Right-sided numbness since 07/30 last night EXAM: CT HEAD WITHOUT CONTRAST TECHNIQUE: Contiguous axial images were obtained from the base of the skull through the vertex without intravenous contrast. COMPARISON:  None. FINDINGS: Brain: No evidence of acute infarction, hemorrhage, hydrocephalus, extra-axial collection or mass lesion/mass effect. Mild periventricular and deep white matter hypodensity. Vascular: No hyperdense vessel or unexpected calcification. Skull: Normal. Negative for fracture or focal lesion. Sinuses/Orbits: No acute finding. Other: None. IMPRESSION: 1. No acute intracranial pathology. No non-contrast CT evidence of acute stroke or hemorrhage. 2.  Small-vessel white matter disease. Electronically Signed   By: Lauralyn Primes M.D.   On: 11/30/2019 19:10   MR ANGIO HEAD WO CONTRAST  Result Date: 12/02/2019 CLINICAL DATA:  Right-sided weakness and numbness. EXAM: MR HEAD WITHOUT CONTRAST MR CIRCLE OF WILLIS WITHOUT CONTRAST MRA OF THE NECK WITHOUT AND WITH CONTRAST TECHNIQUE: Multiplanar, multiecho pulse sequences of the brain, circle of willis and surrounding structures were obtained without intravenous contrast. Angiographic images of the neck were obtained using MRA technique without and with intravenous  contrast. CONTRAST:  5.14mL GADAVIST GADOBUTROL 1 MMOL/ML IV SOLN COMPARISON:  None. FINDINGS: MR HEAD FINDINGS Brain: There is a small acute infarct in the left thalamus. Multifocal hyperintense T2-weighted signal within the white matter. Normal volume of CSF spaces. No chronic microhemorrhage. Normal midline structures. Vascular: Normal flow voids. Skull and upper cervical spine: Normal marrow signal. Sinuses/Orbits: Negative. Other: None. MR CIRCLE OF WILLIS FINDINGS POSTERIOR CIRCULATION: --Vertebral arteries: Normal V4 segments. --Inferior cerebellar arteries: Normal. --Basilar artery: Normal. --Superior cerebellar arteries: Normal. --Posterior cerebral arteries: Normal. There are bilateral posterior communicating arteries (p-comm) that partially supply the PCAs. ANTERIOR CIRCULATION: --Intracranial internal carotid arteries: Normal. --Anterior cerebral arteries (ACA): Normal. Both A1 segments are present. Patent anterior communicating artery (a-comm). --Middle cerebral arteries (MCA): Normal. MRA NECK FINDINGS Normal carotid and vertebral arteries. IMPRESSION: 1. Small acute infarct in the left thalamus. No hemorrhage or mass effect. 2. Normal MRA of the head and neck. Electronically Signed   By: Deatra Robinson M.D.   On: 12/02/2019 00:41   MR ANGIO NECK W WO CONTRAST  Result Date: 12/02/2019 CLINICAL DATA:  Right-sided weakness and numbness. EXAM: MR HEAD WITHOUT CONTRAST MR CIRCLE OF WILLIS WITHOUT CONTRAST MRA OF THE NECK WITHOUT AND WITH CONTRAST TECHNIQUE: Multiplanar, multiecho pulse sequences of the brain, circle of willis and surrounding structures were obtained without intravenous contrast. Angiographic images of the neck were obtained using MRA technique without  and with intravenous contrast. CONTRAST:  5.67mL GADAVIST GADOBUTROL 1 MMOL/ML IV SOLN COMPARISON:  None. FINDINGS: MR HEAD FINDINGS Brain: There is a small acute infarct in the left thalamus. Multifocal hyperintense T2-weighted signal  within the white matter. Normal volume of CSF spaces. No chronic microhemorrhage. Normal midline structures. Vascular: Normal flow voids. Skull and upper cervical spine: Normal marrow signal. Sinuses/Orbits: Negative. Other: None. MR CIRCLE OF WILLIS FINDINGS POSTERIOR CIRCULATION: --Vertebral arteries: Normal V4 segments. --Inferior cerebellar arteries: Normal. --Basilar artery: Normal. --Superior cerebellar arteries: Normal. --Posterior cerebral arteries: Normal. There are bilateral posterior communicating arteries (p-comm) that partially supply the PCAs. ANTERIOR CIRCULATION: --Intracranial internal carotid arteries: Normal. --Anterior cerebral arteries (ACA): Normal. Both A1 segments are present. Patent anterior communicating artery (a-comm). --Middle cerebral arteries (MCA): Normal. MRA NECK FINDINGS Normal carotid and vertebral arteries. IMPRESSION: 1. Small acute infarct in the left thalamus. No hemorrhage or mass effect. 2. Normal MRA of the head and neck. Electronically Signed   By: Deatra Robinson M.D.   On: 12/02/2019 00:41   MR BRAIN WO CONTRAST  Result Date: 12/02/2019 CLINICAL DATA:  Right-sided weakness and numbness. EXAM: MR HEAD WITHOUT CONTRAST MR CIRCLE OF WILLIS WITHOUT CONTRAST MRA OF THE NECK WITHOUT AND WITH CONTRAST TECHNIQUE: Multiplanar, multiecho pulse sequences of the brain, circle of willis and surrounding structures were obtained without intravenous contrast. Angiographic images of the neck were obtained using MRA technique without and with intravenous contrast. CONTRAST:  5.63mL GADAVIST GADOBUTROL 1 MMOL/ML IV SOLN COMPARISON:  None. FINDINGS: MR HEAD FINDINGS Brain: There is a small acute infarct in the left thalamus. Multifocal hyperintense T2-weighted signal within the white matter. Normal volume of CSF spaces. No chronic microhemorrhage. Normal midline structures. Vascular: Normal flow voids. Skull and upper cervical spine: Normal marrow signal. Sinuses/Orbits: Negative. Other:  None. MR CIRCLE OF WILLIS FINDINGS POSTERIOR CIRCULATION: --Vertebral arteries: Normal V4 segments. --Inferior cerebellar arteries: Normal. --Basilar artery: Normal. --Superior cerebellar arteries: Normal. --Posterior cerebral arteries: Normal. There are bilateral posterior communicating arteries (p-comm) that partially supply the PCAs. ANTERIOR CIRCULATION: --Intracranial internal carotid arteries: Normal. --Anterior cerebral arteries (ACA): Normal. Both A1 segments are present. Patent anterior communicating artery (a-comm). --Middle cerebral arteries (MCA): Normal. MRA NECK FINDINGS Normal carotid and vertebral arteries. IMPRESSION: 1. Small acute infarct in the left thalamus. No hemorrhage or mass effect. 2. Normal MRA of the head and neck. Electronically Signed   By: Deatra Robinson M.D.   On: 12/02/2019 00:41   ECHOCARDIOGRAM COMPLETE  Result Date: 12/02/2019    ECHOCARDIOGRAM REPORT   Patient Name:   RIHAM Pincock Date of Exam: 12/02/2019 Medical Rec #:  [id number]   Height:       62.0 in Accession #:    [id number]  Weight:       119.3 lb Date of Birth:  Jul 24, 1955    BSA:          1.535 m Patient Age:    64 years    BP:           121/87 mmHg Patient Gender: F           HR:           72 bpm. Exam Location:  Inpatient Procedure: 2D Echo, 3D Echo, Cardiac Doppler, Color Doppler and Strain Analysis Indications:    Stroke  History:        Patient has no prior history of Echocardiogram examinations.  Risk Factors:Hypertension.  Sonographer:    Sheralyn Boatman RDCS Referring Phys: [id number] [facility]  Sonographer Comments: Image acquisition challenging due to respiratory motion. Global longitudinal strain was attempted. IMPRESSIONS  1. Left ventricular ejection fraction, by estimation, is 60 to 65%. The left ventricle has normal function. The left ventricle has no regional wall motion abnormalities. Left ventricular diastolic parameters are indeterminate. The average left ventricular global longitudinal  strain is -22.2 %. The global longitudinal strain is normal.  2. Right ventricular systolic function is normal. The right ventricular size is normal. Tricuspid regurgitation signal is inadequate for assessing PA pressure.  3. The mitral valve is normal in structure. Trivial mitral valve regurgitation. No evidence of mitral stenosis.  4. The aortic valve is tricuspid. Aortic valve regurgitation is not visualized. Mild aortic valve sclerosis is present, with no evidence of aortic valve stenosis.  5. The inferior vena cava is normal in size with greater than 50% respiratory variability, suggesting right atrial pressure of 3 mmHg. FINDINGS  Left Ventricle: Left ventricular ejection fraction, by estimation, is 60 to 65%. The left ventricle has normal function. The left ventricle has no regional wall motion abnormalities. The average left ventricular global longitudinal strain is -22.2 %. The global longitudinal strain is normal. The left ventricular internal cavity size was normal in size. There is no left ventricular hypertrophy. Left ventricular diastolic parameters are indeterminate. Normal left ventricular filling pressure. Right Ventricle: The right ventricular size is normal. No increase in right ventricular wall thickness. Right ventricular systolic function is normal. Tricuspid regurgitation signal is inadequate for assessing PA pressure. Left Atrium: Left atrial size was normal in size. Right Atrium: Right atrial size was normal in size. Pericardium: There is no evidence of pericardial effusion. Mitral Valve: The mitral valve is normal in structure. Trivial mitral valve regurgitation. No evidence of mitral valve stenosis. Tricuspid Valve: The tricuspid valve is normal in structure. Tricuspid valve regurgitation is mild . No evidence of tricuspid stenosis. Aortic Valve: The aortic valve is tricuspid. Aortic valve regurgitation is not visualized. Mild aortic valve sclerosis is present, with no evidence of aortic  valve stenosis. Pulmonic Valve: The pulmonic valve was normal in structure. Pulmonic valve regurgitation is trivial. No evidence of pulmonic stenosis. Aorta: The aortic root is normal in size and structure. Venous: The inferior vena cava is normal in size with greater than 50% respiratory variability, suggesting right atrial pressure of 3 mmHg. IAS/Shunts: No atrial level shunt detected by color flow Doppler.  LEFT VENTRICLE PLAX 2D LVIDd:         3.90 cm     Diastology LVIDs:         2.50 cm     LV e' medial:    6.96 cm/s LV PW:         1.00 cm     LV E/e' medial:  9.9 LV IVS:        0.80 cm     LV e' lateral:   9.68 cm/s LVOT diam:     1.90 cm     LV E/e' lateral: 7.1 LV SV:         44 LV SV Index:   28          2D Longitudinal Strain LVOT Area:     2.84 cm    2D Strain GLS (A2C):   -23.5 %  2D Strain GLS (A3C):   -18.9 %                            2D Strain GLS (A4C):   -24.3 % LV Volumes (MOD)           2D Strain GLS Avg:     -22.2 % LV vol d, MOD A2C: 38.8 ml LV vol d, MOD A4C: 51.1 ml LV vol s, MOD A2C: 13.1 ml LV vol s, MOD A4C: 20.5 ml 3D Volume EF: LV SV MOD A2C:     25.7 ml 3D EF:        58 % LV SV MOD A4C:     51.1 ml LV EDV:       83 ml LV SV MOD BP:      27.8 ml LV ESV:       35 ml                            LV SV:        48 ml RIGHT VENTRICLE             IVC RV S prime:     11.60 cm/s  IVC diam: 0.70 cm TAPSE (M-mode): 1.4 cm LEFT ATRIUM             Index       RIGHT ATRIUM          Index LA diam:        3.30 cm 2.15 cm/m  RA Area:     9.90 cm LA Vol (A2C):   18.5 ml 12.06 ml/m RA Volume:   21.70 ml 14.14 ml/m LA Vol (A4C):   20.1 ml 13.10 ml/m LA Biplane Vol: 20.4 ml 13.29 ml/m  AORTIC VALVE             PULMONIC VALVE LVOT Vmax:   89.40 cm/s  PV Vmax:          1.36 m/s LVOT Vmean:  61.200 cm/s PV Peak grad:     7.4 mmHg LVOT VTI:    0.154 m     PR End Diast Vel: 1.37 msec  AORTA Ao Root diam: 2.50 cm Ao Asc diam:  3.30 cm MITRAL VALVE MV Area (PHT): 2.62 cm    SHUNTS  MV Decel Time: 289 msec    Systemic VTI:  0.15 m MV E velocity: 68.60 cm/s  Systemic Diam: 1.90 cm MV A velocity: 70.70 cm/s MV E/A ratio:  0.97 Armanda Magicraci Turner MD Electronically signed by Armanda Magicraci Turner MD Signature Date/Time: 12/02/2019/9:33:28 AM    Final     DISCHARGE EXAMINATION: Vitals:   12/02/19 0100 12/02/19 0402 12/02/19 0757 12/02/19 1212  BP: 124/89 121/87 (!) 135/98 (!) 125/95  Pulse: 74 75 73 85  Resp: 20 20 18 18   Temp: 97.8 F (36.6 C) 97.8 F (36.6 C) 98.2 F (36.8 C) 98 F (36.7 C)  TempSrc: Oral Oral Oral Oral  SpO2: 99% 98% 100% 100%  Weight:      Height:       General appearance: Awake alert.  In no distress Resp: Clear to auscultation bilaterally.  Normal effort Cardio: S1-S2 is normal regular.  No S3-S4.  No rubs murmurs or bruit GI: Abdomen is soft.  Nontender nondistended.  Bowel sounds are present normal.  No masses organomegaly Extremities: No edema.  Full range of motion of lower extremities.  DISPOSITION: Home  Discharge Instructions    Ambulatory referral to Neurology   Complete by: As directed    Follow up with stroke clinic NP (Jessica Vanschaick or Darrol Angel, if both not available, consider Manson Allan, or Ahern) at [facility] in about 4 weeks. Thanks.   Call MD for:  difficulty breathing, headache or visual disturbances   Complete by: As directed    Call MD for:  extreme fatigue   Complete by: As directed    Call MD for:  persistant dizziness or light-headedness   Complete by: As directed    Call MD for:  persistant nausea and vomiting   Complete by: As directed    Call MD for:  severe uncontrolled pain   Complete by: As directed    Call MD for:  temperature >100.4   Complete by: As directed    Diet - low sodium heart healthy   Complete by: As directed    Discharge instructions   Complete by: As directed    Please take your medications as prescribed.  Follow-up with your primary care provider.  You were cared for by a hospitalist  during your hospital stay. If you have any questions about your discharge medications or the care you received while you were in the hospital after you are discharged, you can call the unit and asked to speak with the hospitalist on call if the hospitalist that took care of you is not available. Once you are discharged, your primary care physician will handle any further medical issues. Please note that NO REFILLS for any discharge medications will be authorized once you are discharged, as it is imperative that you return to your primary care physician (or establish a relationship with a primary care physician if you do not have one) for your aftercare needs so that they can reassess your need for medications and monitor your lab values. If you do not have a primary care physician, you can call [phone] for a physician referral.   Increase activity slowly   Complete by: As directed         Allergies as of 12/02/2019      Reactions   Sulfa Antibiotics Hives   Sulfamethoxazole-trimethoprim Hives      Medication List    STOP taking these medications   amoxicillin 500 MG capsule Commonly known as: AMOXIL     TAKE these medications   aspirin 81 MG EC tablet Take 1 tablet (81 mg total) by mouth daily. Swallow whole. Start taking on: December 03, 2019   atorvastatin 80 MG tablet Commonly known as: LIPITOR Take 1 tablet (80 mg total) by mouth daily at 8 pm.   clopidogrel 75 MG tablet Commonly known as: PLAVIX Take 1 tablet (75 mg total) by mouth daily for 21 days. For 3 weeks only Start taking on: December 03, 2019   irbesartan 75 MG tablet Commonly known as: AVAPRO Take 1 tablet (75 mg total) by mouth daily. Start taking on: December 04, 2019   miconazole 2 % cream Commonly known as: Micatin Apply 1 application topically 2 (two) times daily.         Follow-up Information    [facility]. Schedule an appointment as soon as possible for a visit in 4  week(s).   Specialty: Neurology Contact information: [address] [place] [address] [phone]       Felix Pacini A, DO Follow up on 12/17/2019.   Specialty: Family Medicine Why: Please arrive  early and bring your insurance card. Please call your insurance company and make sure they are in network with this office. Appointment time is 1:30 pm. Contact information: [address] [place] [address] [phone]        [facility] Follow up.   Specialty: Home Health Services Why: The home health agency will contact you for the first home visit. Contact information: [address] [place] [address] [phone]               TOTAL DISCHARGE TIME: 35 minutes  Bellina Tokarczyk  [facility] Pager on [email or web address]  12/02/2019, 3:57 PM

## 2019-12-02 NOTE — Evaluation (Addendum)
Physical Therapy Evaluation Patient Details Name: Ann Schmidt MRN: [id number] DOB: June 14, 1955 Today's Date: 12/02/2019   History of Present Illness  Pt is a 64 yo female admitted with L thalamic lacunar infarct. PMH significant for HTN.  Clinical Impression  Pt admitted with above diagnosis. Pt currently with functional limitations due to the deficits listed below (see PT Problem List). At the time of PT eval pt was able to perform transfers with modified independence and ambulation/stair negotiation with min guard assist to supervision for safety. She scored 17/24 on the DGI indicating a higher risk for falls. Pt with decreased safety awareness throughout session and questionable perception of current situation as well as memory. I think this patient would benefit from having some supervision at d/c however pt reports she will be alone. From a PT standpoint, home health follow-up would be most appropriate at this time due to cognitive deficits and decreased insight into deficits. Acutely, pt will benefit from skilled PT to increase their independence and safety with mobility to allow discharge to the venue listed below.       Follow Up Recommendations Home Health PT;Supervision for mobility/OOB    Equipment Recommendations  None recommended by PT    Recommendations for Other Services       Precautions / Restrictions Precautions Precautions: None Precaution Comments: "Low fall risk" Restrictions Weight Bearing Restrictions: No      Mobility  Bed Mobility               General bed mobility comments: Pt was received sitting up EOB when PT arrived.   Transfers Overall transfer level: Modified independent Equipment used: None Transfers: Sit to/from Stand           General transfer comment: No assist required. Pt was controlled and deliberate with sit<>stand with no unsteadiness noted.   Ambulation/Gait Ambulation/Gait assistance: Supervision Gait Distance (Feet): 500  Feet Assistive device: None Gait Pattern/deviations: Step-through pattern;Decreased stance time - right;Decreased weight shift to right;Decreased dorsiflexion - right;Drifts right/left;Antalgic Gait velocity: Decreased Gait velocity interpretation: 1.31 - 2.62 ft/sec, indicative of limited community ambulator General Gait Details: Mildly antalgic with occasional unsteadiness. Pt negotiated around unit well and no difficulty with path finding activity.   Stairs Stairs: Yes Stairs assistance: Supervision; Psychiatrist Stair Management: Two rails;Alternating pattern;Step to pattern;Forwards Number of Stairs: 30 (3 flights) General stair comments: Practiced 3 flights of stairs to simulate home environment. VC's for sequencing and general safety. Pt cued for step-to pattern due to weaker RLE however pt insisting on alternating steps despite unsteadiness and decreased safety. Occasional min guard provided throughout stair training.   Wheelchair Mobility    Modified Rankin (Stroke Patients Only) Modified Rankin (Stroke Patients Only) Pre-Morbid Rankin Score: No symptoms Modified Rankin: Moderately severe disability     Balance Overall balance assessment: Needs assistance Sitting-balance support: Feet supported;No upper extremity supported Sitting balance-Leahy Scale: Good     Standing balance support: No upper extremity supported Standing balance-Leahy Scale: Fair                   Standardized Balance Assessment Standardized Balance Assessment : Dynamic Gait Index   Dynamic Gait Index Level Surface: Mild Impairment Change in Gait Speed: Mild Impairment Gait with Horizontal Head Turns: Mild Impairment Gait with Vertical Head Turns: Mild Impairment Gait and Pivot Turn: Normal Step Over Obstacle: Mild Impairment Step Around Obstacles: Mild Impairment Steps: Mild Impairment Total Score: 17       Pertinent Vitals/Pain Pain Assessment: No/denies  pain    Home Living  Family/patient expects to be discharged to:: Private residence Living Arrangements: Alone Available Help at Discharge: Friend(s);Available PRN/intermittently Type of Home: Apartment Home Access: Stairs to enter Entrance Stairs-Rails: Right;Left;Can reach both Entrance Stairs-Number of Steps: 3 flights Home Layout: One level Home Equipment: None Additional Comments: Has church members that could check on her upon d/c but no consistent support and seems to prefer not to have help. States she would rather call an Benedetto Goad than ask her church members for help.     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      Hand Dominance   Dominant Hand: Right    Extremity/Trunk Assessment   Upper Extremity Assessment Upper Extremity Assessment: RUE deficits/detail RUE Deficits / Details: Mildly decreased strength compared to LUE - grossly 4/5 in shoulder flexion, biceps, triceps RUE Sensation: decreased light touch (triceps down to fingertips) RUE Coordination: decreased fine motor (finger to nose)    Lower Extremity Assessment Lower Extremity Assessment: RLE deficits/detail RLE Deficits / Details: Mildly decreased strength - grossly 4-/5 in hip flexors, quads, hamstrings, ankle DF RLE Sensation: decreased light touch RLE Coordination: decreased gross motor    Cervical / Trunk Assessment Cervical / Trunk Assessment: Normal  Communication   Communication: No difficulties  Cognition Arousal/Alertness: Awake/alert Behavior During Therapy: WFL for tasks assessed/performed Overall Cognitive Status: Impaired/Different from baseline Area of Impairment: Safety/judgement;Problem solving;Memory   Memory:  (questionable memory)   Safety/Judgement: Decreased awareness of safety;Decreased awareness of deficits   Problem Solving: Requires  verbal cues General Comments: Pt with apparent skewed idea of situation. Oriented x4 however comparing this stroke to breaking her ankle in the 6th grade. Details of this story were not consistent and appeared confabulated. Occasionally will admit to deficits but largely reporting she is "fine"      General Comments      Exercises     Assessment/Plan    PT Assessment Patient needs continued PT services  PT Problem List Decreased strength;Decreased activity tolerance;Decreased balance;Decreased cognition;Decreased coordination;Decreased mobility;Decreased knowledge of use of DME;Decreased safety awareness;Decreased knowledge of precautions;Impaired sensation       PT Treatment Interventions DME instruction;Gait training;Functional mobility training;Stair training;Therapeutic activities;Therapeutic exercise;Neuromuscular re-education;Patient/family education    PT Goals (Current goals can be found in the Care Plan section)  Acute Rehab PT Goals Patient Stated Goal: Home ASAP PT Goal Formulation: With patient Time For Goal Achievement: 12/09/19 Potential to Achieve Goals: Good    Frequency Min 4X/week   Barriers to discharge Decreased caregiver support Lives alone    Co-evaluation               AM-PAC PT "6 Clicks" Mobility  Outcome Measure Help needed turning from your back to your side while in a flat bed without using bedrails?: None Help needed moving from lying on your back to sitting on the side of a flat bed without using bedrails?: None Help needed moving to and from a bed to a chair (including a wheelchair)?: None Help needed standing up from a chair using your arms (e.g., wheelchair or bedside chair)?: None Help needed to walk in hospital room?: A Little Help needed climbing 3-5 steps with a railing? : A Little 6 Click Score: 22    End of Session Equipment Utilized During Treatment: Gait belt Activity  Tolerance: Patient tolerated treatment well Patient  left: in chair;with call bell/phone within reach;with chair alarm set Nurse Communication: Mobility status PT Visit Diagnosis: Unsteadiness on feet (R26.81);Hemiplegia and hemiparesis Hemiplegia - Right/Left: Right Hemiplegia - dominant/non-dominant: Dominant Hemiplegia - caused by: Cerebral infarction    Time: 1021-1058 PT Time Calculation (min) (ACUTE ONLY): 37 min   Charges:   PT Evaluation $PT Eval Moderate Complexity: 1 Mod PT Treatments $Gait Training: 8-22 mins        Conni Slipper, PT, DPT Acute Rehabilitation Services Pager: [phone] Office: [phone]   Marylynn Pearson 12/02/2019, 2:25 PM

## 2019-12-02 NOTE — Progress Notes (Signed)
°  Echocardiogram 2D Echocardiogram has been performed.  Ann Schmidt 12/02/2019, 9:15 AM

## 2019-12-02 NOTE — Progress Notes (Signed)
STROKE TEAM PROGRESS NOTE   INTERVAL HISTORY Her RN is at the bedside.  Patient sitting in chair, awake alert, orientated.  Still complains of patchy right forearm, fingertips, right leg calf area numbness/tingling.  Mild deficit of right knee extension.  Otherwise neuro intact.  MRI showed left thalamic infarct.  On DAPT and statin now.  Vitals:   12/01/19 2111 12/02/19 0100 12/02/19 0402 12/02/19 0757  BP: (!) 148/115 124/89 121/87 (!) 135/98  Pulse: 74 74 75 73  Resp: 18 20 20 18   Temp: 98.6 F (37 C) 97.8 F (36.6 C) 97.8 F (36.6 C) 98.2 F (36.8 C)  TempSrc: Oral Oral Oral Oral  SpO2: 96% 99% 98% 100%  Weight:      Height:       CBC:  Recent Labs  Lab 11/30/19 1847  WBC 6.3  NEUTROABS 3.1  HGB 14.0  HCT 41.6  MCV 91.4  PLT 205   Basic Metabolic Panel:  Recent Labs  Lab 11/30/19 1847  NA 137  K 3.5  CL 100  CO2 25  GLUCOSE 171*  BUN 18  CREATININE 0.95  CALCIUM 10.3   Lipid Panel:  Recent Labs  Lab 12/02/19 0238  CHOL 232*  TRIG 119  HDL 43  CHOLHDL 5.4  VLDL 24  LDLCALC 12/04/19*   HgbA1c:  Recent Labs  Lab 12/02/19 0238  HGBA1C 6.0*   Urine Drug Screen:  Recent Labs  Lab 11/30/19 2034  LABOPIA NONE DETECTED  COCAINSCRNUR NONE DETECTED  LABBENZ NONE DETECTED  AMPHETMU NONE DETECTED  THCU NONE DETECTED  LABBARB NONE DETECTED    Alcohol Level  Recent Labs  Lab 11/30/19 1847  ETH <10    IMAGING past 24 hours MR ANGIO HEAD WO CONTRAST  Result Date: 12/02/2019 CLINICAL DATA:  Right-sided weakness and numbness. EXAM: MR HEAD WITHOUT CONTRAST MR CIRCLE OF WILLIS WITHOUT CONTRAST MRA OF THE NECK WITHOUT AND WITH CONTRAST TECHNIQUE: Multiplanar, multiecho pulse sequences of the brain, circle of willis and surrounding structures were obtained without intravenous contrast. Angiographic images of the neck were obtained using MRA technique without and with intravenous contrast. CONTRAST:  5.41mL GADAVIST GADOBUTROL 1 MMOL/ML IV SOLN COMPARISON:   None. FINDINGS: MR HEAD FINDINGS Brain: There is a small acute infarct in the left thalamus. Multifocal hyperintense T2-weighted signal within the white matter. Normal volume of CSF spaces. No chronic microhemorrhage. Normal midline structures. Vascular: Normal flow voids. Skull and upper cervical spine: Normal marrow signal. Sinuses/Orbits: Negative. Other: None. MR CIRCLE OF WILLIS FINDINGS POSTERIOR CIRCULATION: --Vertebral arteries: Normal V4 segments. --Inferior cerebellar arteries: Normal. --Basilar artery: Normal. --Superior cerebellar arteries: Normal. --Posterior cerebral arteries: Normal. There are bilateral posterior communicating arteries (p-comm) that partially supply the PCAs. ANTERIOR CIRCULATION: --Intracranial internal carotid arteries: Normal. --Anterior cerebral arteries (ACA): Normal. Both A1 segments are present. Patent anterior communicating artery (a-comm). --Middle cerebral arteries (MCA): Normal. MRA NECK FINDINGS Normal carotid and vertebral arteries. IMPRESSION: 1. Small acute infarct in the left thalamus. No hemorrhage or mass effect. 2. Normal MRA of the head and neck. Electronically Signed   By: 4m M.D.   On: 12/02/2019 00:41   MR ANGIO NECK W WO CONTRAST  Result Date: 12/02/2019 CLINICAL DATA:  Right-sided weakness and numbness. EXAM: MR HEAD WITHOUT CONTRAST MR CIRCLE OF WILLIS WITHOUT CONTRAST MRA OF THE NECK WITHOUT AND WITH CONTRAST TECHNIQUE: Multiplanar, multiecho pulse sequences of the brain, circle of willis and surrounding structures were obtained without intravenous contrast. Angiographic images of the neck  were obtained using MRA technique without and with intravenous contrast. CONTRAST:  5.38mL GADAVIST GADOBUTROL 1 MMOL/ML IV SOLN COMPARISON:  None. FINDINGS: MR HEAD FINDINGS Brain: There is a small acute infarct in the left thalamus. Multifocal hyperintense T2-weighted signal within the white matter. Normal volume of CSF spaces. No chronic microhemorrhage.  Normal midline structures. Vascular: Normal flow voids. Skull and upper cervical spine: Normal marrow signal. Sinuses/Orbits: Negative. Other: None. MR CIRCLE OF WILLIS FINDINGS POSTERIOR CIRCULATION: --Vertebral arteries: Normal V4 segments. --Inferior cerebellar arteries: Normal. --Basilar artery: Normal. --Superior cerebellar arteries: Normal. --Posterior cerebral arteries: Normal. There are bilateral posterior communicating arteries (p-comm) that partially supply the PCAs. ANTERIOR CIRCULATION: --Intracranial internal carotid arteries: Normal. --Anterior cerebral arteries (ACA): Normal. Both A1 segments are present. Patent anterior communicating artery (a-comm). --Middle cerebral arteries (MCA): Normal. MRA NECK FINDINGS Normal carotid and vertebral arteries. IMPRESSION: 1. Small acute infarct in the left thalamus. No hemorrhage or mass effect. 2. Normal MRA of the head and neck. Electronically Signed   By: Deatra Robinson M.D.   On: 12/02/2019 00:41   MR BRAIN WO CONTRAST  Result Date: 12/02/2019 CLINICAL DATA:  Right-sided weakness and numbness. EXAM: MR HEAD WITHOUT CONTRAST MR CIRCLE OF WILLIS WITHOUT CONTRAST MRA OF THE NECK WITHOUT AND WITH CONTRAST TECHNIQUE: Multiplanar, multiecho pulse sequences of the brain, circle of willis and surrounding structures were obtained without intravenous contrast. Angiographic images of the neck were obtained using MRA technique without and with intravenous contrast. CONTRAST:  5.70mL GADAVIST GADOBUTROL 1 MMOL/ML IV SOLN COMPARISON:  None. FINDINGS: MR HEAD FINDINGS Brain: There is a small acute infarct in the left thalamus. Multifocal hyperintense T2-weighted signal within the white matter. Normal volume of CSF spaces. No chronic microhemorrhage. Normal midline structures. Vascular: Normal flow voids. Skull and upper cervical spine: Normal marrow signal. Sinuses/Orbits: Negative. Other: None. MR CIRCLE OF WILLIS FINDINGS POSTERIOR CIRCULATION: --Vertebral arteries:  Normal V4 segments. --Inferior cerebellar arteries: Normal. --Basilar artery: Normal. --Superior cerebellar arteries: Normal. --Posterior cerebral arteries: Normal. There are bilateral posterior communicating arteries (p-comm) that partially supply the PCAs. ANTERIOR CIRCULATION: --Intracranial internal carotid arteries: Normal. --Anterior cerebral arteries (ACA): Normal. Both A1 segments are present. Patent anterior communicating artery (a-comm). --Middle cerebral arteries (MCA): Normal. MRA NECK FINDINGS Normal carotid and vertebral arteries. IMPRESSION: 1. Small acute infarct in the left thalamus. No hemorrhage or mass effect. 2. Normal MRA of the head and neck. Electronically Signed   By: Deatra Robinson M.D.   On: 12/02/2019 00:41   ECHOCARDIOGRAM COMPLETE  Result Date: 12/02/2019    ECHOCARDIOGRAM REPORT   Patient Name:   Ann Schmidt Date of Exam: 12/02/2019 Medical Rec #:  [id number]   Height:       62.0 in Accession #:    [id number]  Weight:       119.3 lb Date of Birth:  [birth year]    BSA:          1.535 m Patient Age:    64 years    BP:           121/87 mmHg Patient Gender: F           HR:           72 bpm. Exam Location:  Inpatient Procedure: 2D Echo, 3D Echo, Cardiac Doppler, Color Doppler and Strain Analysis Indications:    Stroke  History:        Patient has no prior history of Echocardiogram examinations.  Risk Factors:Hypertension.  Sonographer:    Sheralyn Boatmanina West RDCS Referring Phys: [id number] [facility] LLC[facility]ADLEY S CHOTINER  Sonographer Comments: Image acquisition challenging due to respiratory motion. Global longitudinal strain was attempted. IMPRESSIONS  1. Left ventricular ejection fraction, by estimation, is 60 to 65%. The left ventricle has normal function. The left ventricle has no regional wall motion abnormalities. Left ventricular diastolic parameters are indeterminate. The average left ventricular global longitudinal strain is -22.2 %. The global longitudinal strain is normal.  2. Right  ventricular systolic function is normal. The right ventricular size is normal. Tricuspid regurgitation signal is inadequate for assessing PA pressure.  3. The mitral valve is normal in structure. Trivial mitral valve regurgitation. No evidence of mitral stenosis.  4. The aortic valve is tricuspid. Aortic valve regurgitation is not visualized. Mild aortic valve sclerosis is present, with no evidence of aortic valve stenosis.  5. The inferior vena cava is normal in size with greater than 50% respiratory variability, suggesting right atrial pressure of 3 mmHg. FINDINGS  Left Ventricle: Left ventricular ejection fraction, by estimation, is 60 to 65%. The left ventricle has normal function. The left ventricle has no regional wall motion abnormalities. The average left ventricular global longitudinal strain is -22.2 %. The global longitudinal strain is normal. The left ventricular internal cavity size was normal in size. There is no left ventricular hypertrophy. Left ventricular diastolic parameters are indeterminate. Normal left ventricular filling pressure. Right Ventricle: The right ventricular size is normal. No increase in right ventricular wall thickness. Right ventricular systolic function is normal. Tricuspid regurgitation signal is inadequate for assessing PA pressure. Left Atrium: Left atrial size was normal in size. Right Atrium: Right atrial size was normal in size. Pericardium: There is no evidence of pericardial effusion. Mitral Valve: The mitral valve is normal in structure. Trivial mitral valve regurgitation. No evidence of mitral valve stenosis. Tricuspid Valve: The tricuspid valve is normal in structure. Tricuspid valve regurgitation is mild . No evidence of tricuspid stenosis. Aortic Valve: The aortic valve is tricuspid. Aortic valve regurgitation is not visualized. Mild aortic valve sclerosis is present, with no evidence of aortic valve stenosis. Pulmonic Valve: The pulmonic valve was normal in  structure. Pulmonic valve regurgitation is trivial. No evidence of pulmonic stenosis. Aorta: The aortic root is normal in size and structure. Venous: The inferior vena cava is normal in size with greater than 50% respiratory variability, suggesting right atrial pressure of 3 mmHg. IAS/Shunts: No atrial level shunt detected by color flow Doppler.  LEFT VENTRICLE PLAX 2D LVIDd:         3.90 cm     Diastology LVIDs:         2.50 cm     LV e' medial:    6.96 cm/s LV PW:         1.00 cm     LV E/e' medial:  9.9 LV IVS:        0.80 cm     LV e' lateral:   9.68 cm/s LVOT diam:     1.90 cm     LV E/e' lateral: 7.1 LV SV:         44 LV SV Index:   28          2D Longitudinal Strain LVOT Area:     2.84 cm    2D Strain GLS (A2C):   -23.5 %  2D Strain GLS (A3C):   -18.9 %                            2D Strain GLS (A4C):   -24.3 % LV Volumes (MOD)           2D Strain GLS Avg:     -22.2 % LV vol d, MOD A2C: 38.8 ml LV vol d, MOD A4C: 51.1 ml LV vol s, MOD A2C: 13.1 ml LV vol s, MOD A4C: 20.5 ml 3D Volume EF: LV SV MOD A2C:     25.7 ml 3D EF:        58 % LV SV MOD A4C:     51.1 ml LV EDV:       83 ml LV SV MOD BP:      27.8 ml LV ESV:       35 ml                            LV SV:        48 ml RIGHT VENTRICLE             IVC RV S prime:     11.60 cm/s  IVC diam: 0.70 cm TAPSE (M-mode): 1.4 cm LEFT ATRIUM             Index       RIGHT ATRIUM          Index LA diam:        3.30 cm 2.15 cm/m  RA Area:     9.90 cm LA Vol (A2C):   18.5 ml 12.06 ml/m RA Volume:   21.70 ml 14.14 ml/m LA Vol (A4C):   20.1 ml 13.10 ml/m LA Biplane Vol: 20.4 ml 13.29 ml/m  AORTIC VALVE             PULMONIC VALVE LVOT Vmax:   89.40 cm/s  PV Vmax:          1.36 m/s LVOT Vmean:  61.200 cm/s PV Peak grad:     7.4 mmHg LVOT VTI:    0.154 m     PR End Diast Vel: 1.37 msec  AORTA Ao Root diam: 2.50 cm Ao Asc diam:  3.30 cm MITRAL VALVE MV Area (PHT): 2.62 cm    SHUNTS MV Decel Time: 289 msec    Systemic VTI:  0.15 m MV E velocity:  68.60 cm/s  Systemic Diam: 1.90 cm MV A velocity: 70.70 cm/s MV E/A ratio:  0.97 Armanda Magic MD Electronically signed by Armanda Magic MD Signature Date/Time: 12/02/2019/9:33:28 AM    Final     PHYSICAL EXAM  Temp:  [97.8 F (36.6 C)-99.4 F (37.4 C)] 98 F (36.7 C) (09/20 1212) Pulse Rate:  [64-95] 85 (09/20 1212) Resp:  [17-21] 18 (09/20 1212) BP: (121-171)/(87-129) 125/95 (09/20 1212) SpO2:  [96 %-100 %] 100 % (09/20 1212) Weight:  [54.1 kg] 54.1 kg (09/19 1900)  General - Well nourished, well developed, in no apparent distress.  Ophthalmologic - fundi not visualized due to noncooperation.  Cardiovascular - Regular rhythm and rate.  Mental Status -  Level of arousal and orientation to time, place, and person were intact. Language including expression, naming, repetition, comprehension was assessed and found intact. Attention span and concentration were normal. Fund of Knowledge was assessed and was intact.  Cranial Nerves II - XII - II - Visual field intact OU.  III, IV, VI - Extraocular movements intact. V - Facial sensation intact bilaterally. VII - Facial movement intact bilaterally. VIII - Hearing & vestibular intact bilaterally. X - Palate elevates symmetrically. XI - Chin turning & shoulder shrug intact bilaterally. XII - Tongue protrusion intact.  Motor Strength - The patient's strength was normal in all extremities and pronator drift was absent except 5 -/5 right knee extension.  Bulk was normal and fasciculations were absent.   Motor Tone - Muscle tone was assessed at the neck and appendages and was normal.  Reflexes - The patient's reflexes were symmetrical in all extremities and she had no pathological reflexes.  Sensory - Light touch, temperature/pinprick were assessed and were symmetrical except right forearm, right fingertip, right calf patchy decrease light touch sensation.    Coordination - The patient had normal movements in the hands with no ataxia or  dysmetria.  Tremor was absent.  Gait and Station - deferred.   ASSESSMENT/PLAN [name] is a 64 y.o. female with history of hypertension presenting with right-sided numbness, weakness and difficulty walking.   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     MRI left thalamic infarct  MRA head and neck unremarkable  2D Echo EF 60 to 65%  LDL 165  HgbA1c 6.0  UDS negative  VTE prophylaxis -SCDs  No antithrombotic prior to admission, now on aspirin 81 mg daily and clopidogrel 75 mg daily DAPT for 3 weeks and then aspirin alone.  Therapy recommendations: Pending  Disposition: Pending  Hypertension  Home meds: None  Stable . Permissive hypertension (OK if < 220/120) but gradually normalize in 2-3 days . Long-term BP goal normotensive  Hyperlipidemia  Home meds: None  LDL 165, goal < 70  Add Lipitor 80  Continue statin at discharge  Other Stroke Risk Factors  ETOH use, alcohol level <10, advised to drink no more than 1 drink(s) a day  Other Active Problems    Hospital day # 1  Neurology will sign off. Please call with questions. Pt will follow up with stroke clinic NP at [facility] in about 4 weeks. Thanks for the consult.  Marvel Plan, MD PhD Stroke Neurology 12/02/2019 1:20 PM   To contact Stroke Continuity provider, please refer to [email or web address]. After hours, contact General Neurology

## 2019-12-02 NOTE — Plan of Care (Signed)
  Problem: Education: Goal: Knowledge of patient specific risk factors addressed and post discharge goals established will improve Outcome: Progressing   Problem: Education: Goal: Knowledge of disease or condition will improve Outcome: Progressing   Problem: Education: Goal: Knowledge of General Education information will improve Description: Including pain rating scale, medication(s)/side effects and non-pharmacologic comfort measures Outcome: Progressing   Problem: Education: Goal: Knowledge of patient specific risk factors addressed and post discharge goals established will improve Outcome: Progressing

## 2019-12-02 NOTE — TOC Initial Note (Signed)
Transition of Care [facility]) - Initial/Assessment Note    Patient Details  Name: Ann Schmidt MRN: [id number] Date of Birth: 04/25/1955  Transition of Care [facility]) CM/SW Contact:    Kermit Balo, RN Phone Number: 12/02/2019, 3:49 PM  Clinical Narrative:                 Pt lives alone on 3rd floor apartment. She has church friends that will check in on her and call her but no local family. Recommendations for [facility] services. CM provided choice and [facility] selected. Cory with [facility] accepted the referral.  Pt without a PCP. She was asking for [place] at [facility]. They didn't have any appts until Dec. CM updated the pt and she was in agreement with other locations. CM was able to get her an appt at [facility] in [facility] with Dr Claiborne Billings. Appt Oct 5th.  Pt plans to [place] home when medically ready.   Expected Discharge Plan: Home w Home Health Services Barriers to Discharge: Continued Medical Work up   Patient Goals and CMS Choice   CMS Medicare.gov Compare Post Acute Care list provided to:: Patient Choice offered to / list presented to : Patient  Expected Discharge Plan and Services Expected Discharge Plan: Home w Home Health Services   Discharge Planning Services: CM Consult   Living arrangements for the past 2 months: Apartment                           HH Arranged: PT, OT HH Agency: [facility] Date [facility] Agency Contacted: 12/02/19   Representative spoke with at [facility] Agency: Kandee Keen  Prior Living Arrangements/Services Living arrangements for the past 2 months: Apartment Lives with:: Self Patient language and need for interpreter reviewed:: Yes Do you feel safe going back to the place where you live?: Yes      Need for Family Participation in Patient Care: No (Comment) Care giver support system in place?: No (comment)   Criminal Activity/Legal Involvement Pertinent to Current Situation/Hospitalization: No - Comment as needed  Activities of Daily Living      Permission  Sought/Granted                  Emotional Assessment Appearance:: Appears stated age Attitude/Demeanor/Rapport: Engaged Affect (typically observed): Accepting Orientation: : Oriented to Self, Oriented to Place, Oriented to  Time, Oriented to Situation   Psych Involvement: No (comment)  Admission diagnosis:  Stroke-like symptoms [R29.90] Cerebrovascular accident (CVA), unspecified mechanism (HCC) [I63.9] Acute CVA (cerebrovascular accident) [facility]) [I63.9] Patient Active Problem List   Diagnosis Date Noted  . Cerebrovascular accident (CVA) (HCC) 12/01/2019  . Essential hypertension 12/01/2019  . Paresthesia 12/01/2019  . Acute CVA (cerebrovascular accident) (HCC) 12/01/2019  . Stroke-like symptoms 11/30/2019  . Menopausal hot flushes 10/03/2017  . Menopause 10/03/2017   PCP:  Patient, No Pcp Per Pharmacy:   [facility] [place], [place] - [address] [place] [address] Phone: [phone] Fax: [phone]     Social Determinants of Health (SDOH) Interventions    Readmission Risk Interventions No flowsheet data found.

## 2019-12-17 ENCOUNTER — Ambulatory Visit: Payer: 59 | Admitting: Family Medicine

## 2019-12-30 ENCOUNTER — Inpatient Hospital Stay: Payer: 59 | Admitting: Adult Health

## 2020-01-08 ENCOUNTER — Ambulatory Visit (INDEPENDENT_AMBULATORY_CARE_PROVIDER_SITE_OTHER): Payer: 59 | Admitting: Adult Health

## 2020-01-08 ENCOUNTER — Encounter: Payer: Self-pay | Admitting: Adult Health

## 2020-01-08 VITALS — BP 141/94 | HR 91 | Ht 62.0 in | Wt 118.0 lb

## 2020-01-08 DIAGNOSIS — I69398 Other sequelae of cerebral infarction: Secondary | ICD-10-CM | POA: Diagnosis not present

## 2020-01-08 DIAGNOSIS — E785 Hyperlipidemia, unspecified: Secondary | ICD-10-CM

## 2020-01-08 DIAGNOSIS — I10 Essential (primary) hypertension: Secondary | ICD-10-CM

## 2020-01-08 DIAGNOSIS — R29818 Other symptoms and signs involving the nervous system: Secondary | ICD-10-CM

## 2020-01-08 DIAGNOSIS — I639 Cerebral infarction, unspecified: Secondary | ICD-10-CM

## 2020-01-08 DIAGNOSIS — R269 Unspecified abnormalities of gait and mobility: Secondary | ICD-10-CM

## 2020-01-08 DIAGNOSIS — I6381 Other cerebral infarction due to occlusion or stenosis of small artery: Secondary | ICD-10-CM

## 2020-01-08 NOTE — Progress Notes (Signed)
I agree with the above plan 

## 2020-01-08 NOTE — Patient Instructions (Addendum)
Referral placed for outpatient physical and occupational therapy  Continue aspirin 81 mg daily for secondary stroke prevention  We will check cholesterol levels today and if elevated, it will be recommended to restart atorvastatin but will try at a lower dose. If you continue to have difficulty tolerating, we can discuss other options  Continue to follow up with PCP regarding blood pressure and cholesterol management  Maintain strict control of hypertension with blood pressure goal below 130/90 and cholesterol with LDL cholesterol (bad cholesterol) goal below 70 mg/dL.      Followup in the future with me in 3 months or call earlier if needed      Thank you for coming to see [place] at [facility]. I hope we have been able to provide you high quality care today.  You may receive a patient satisfaction survey over the next few weeks. We would appreciate your feedback and comments so that we may continue to improve ourselves and the health of our patients.

## 2020-01-08 NOTE — Progress Notes (Signed)
[facility] [address] [place]. [place] [address] [phone]       HOSPITAL FOLLOW UP NOTE  [name] Date of Birth:  [birth year] Medical Record Number:  [id number]   Reason for Referral:  hospital stroke follow up    SUBJECTIVE:   CHIEF COMPLAINT:  Chief Complaint  Patient presents with  . Hospitalization Follow-up    rm 9, alone, pt reports tightness and numbness on right side, fingers, and right toe    HPI:   [name] is a 64 y.o. female with history of hypertension  who presented on 11/30/2019 with right-sided numbness, weakness and difficulty walking.  Personally reviewed hospitalization pertinent progress notes, lab work and imaging with summary provided.  Evaluated by Dr. 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  MRA head/neck unremarkable.  Recommended DAPT for 3 weeks and aspirin alone.  HTN stable.  LDL 165 and initiate atorvastatin 80 mg daily.  No history of DM with A1c 6.0.  Other stroke risk factors include EtOH use but no prior stroke history.  Evaluated by therapy who recommended [facility] PT/OT for residual imbalance, cognitive deficits, decreased awareness and insight into deficits and right hemisensory impairment.  She was discharged home in stable condition on 12/02/2019.  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     MRI left thalamic infarct  MRA head and neck unremarkable  2D Echo EF 60 to 65%  LDL 165  HgbA1c 6.0  UDS negative  VTE prophylaxis -SCDs  No antithrombotic prior to admission, now on aspirin 81 mg daily and clopidogrel 75 mg daily DAPT for 3 weeks and then aspirin alone.  Therapy recommendations: HH PT/OT  Disposition: home  Today, 01/08/2020, Ann Schmidt is being seen for hospital follow-up unaccompanied.  She reports residual deficits of right arm numbness/tighness and right toe numbness but  overall greatly improving.  She denies residual weakness or imbalance.  She has gradually returned back to her normal activities including work and driving without difficulty.  She unfortunately did not receive home health therapies as her insurance did not cover home health therapies.  She is questioning possibility of outpatient therapy. Denies new or worsening stroke/TIA symptoms.  She remains on aspirin 81 mg daily without bleeding or bruising.  Only able to take Plavix for 3 days due to reports of diarrhea and after stopping, this resolved.  She had difficulty tolerating atorvastatin due to myalgias which resolved after discontinuing.  Blood pressure today 141/94.  She also self discontinued irbesartan due to worsening muscular pain.  She has been using mix of herbal remedies as well as modified diet and increasing exercise and blood pressure has been slowly improving per her report.  No further concerns at this time.      ROS:   14 system review of systems performed and negative with exception of those listed in HPI  PMH: No past medical history on file.  PSH: No past surgical history on file.  Social History:  Social History   Socioeconomic History  . Marital status: Single    Spouse name: Not on file  . Number of children: Not on file  . Years of education: Not on file  . Highest education level: Not on file  Occupational History  . Not on file  Tobacco Use  . Smoking status: Never Smoker  . Smokeless tobacco: Never Used  Substance and Sexual Activity  .  Alcohol use: Yes    Alcohol/week: 2.0 standard drinks    Types: 2 Glasses of wine per week  . Drug use: Never  . Sexual activity: Not Currently    Birth control/protection: None  Other Topics Concern  . Not on file  Social History Narrative  . Not on file   Social Determinants of Health   Financial Resource Strain:   . Difficulty of Paying Living Expenses: Not on file  Food Insecurity:   . Worried About Patent examiner in the Last Year: Not on file  . Ran Out of Food in the Last Year: Not on file  Transportation Needs:   . Lack of Transportation (Medical): Not on file  . Lack of Transportation (Non-Medical): Not on file  Physical Activity:   . Days of Exercise per Week: Not on file  . Minutes of Exercise per Session: Not on file  Stress:   . Feeling of Stress : Not on file  Social Connections:   . Frequency of Communication with Friends and Family: Not on file  . Frequency of Social Gatherings with Friends and Family: Not on file  . Attends Religious Services: Not on file  . Active Member of Clubs or Organizations: Not on file  . Attends Banker Meetings: Not on file  . Marital Status: Not on file  Intimate Partner Violence:   . Fear of Current or Ex-Partner: Not on file  . Emotionally Abused: Not on file  . Physically Abused: Not on file  . Sexually Abused: Not on file    Family History:  Family History  Problem Relation Age of Onset  . Diabetes Mother     Medications:   Current Outpatient Medications on File Prior to Visit  Medication Sig Dispense Refill  . aspirin EC 81 MG EC tablet Take 1 tablet (81 mg total) by mouth daily. Swallow whole. 30 tablet 2   No current facility-administered medications on file prior to visit.    Allergies:   Allergies  Allergen Reactions  . Sulfa Antibiotics Hives  . Sulfamethoxazole-Trimethoprim Hives      OBJECTIVE:  Physical Exam  Vitals:   01/08/20 1508  BP: (!) 141/94  Pulse: 91  Weight: 118 lb (53.5 kg)  Height: 5\' 2"  (1.575 m)   Body mass index is 21.58 kg/m. No exam data present  Post stroke PHQ 2/9 Depression screen PHQ 2/9 01/08/2020  Decreased Interest 0  Down, Depressed, Hopeless 0  PHQ - 2 Score 0     General: well developed, well nourished, very pleasant middle-aged African-American female, seated, in no evident distress Head: head normocephalic and atraumatic.   Neck: supple with no carotid or  supraclavicular bruits Cardiovascular: regular rate and rhythm, no murmurs Musculoskeletal: no deformity Skin:  no rash/petichiae Vascular:  Normal pulses all extremities   Neurologic Exam Mental Status: Awake and fully alert.   Fluent speech and language.  Oriented to place and time. Recent and remote memory intact. Attention span, concentration and fund of knowledge appropriate. Mood and affect appropriate.  Cranial Nerves: Fundoscopic exam reveals sharp disc margins. Pupils equal, briskly reactive to light. Extraocular movements full without nystagmus. Visual fields full to confrontation. Hearing intact. Facial sensation intact. Face, tongue, palate moves normally and symmetrically.  Motor: Normal bulk and tone. Normal strength in all tested extremity muscles except slight RUE pronator drift and mild RLE knee extension and flexion weakness. Sensory.:  Reports very slight decreased light touch sensation proximal right upper and  proximal lower extremity  Coordination: Rapid alternating movements normal in all extremities. Finger-to-nose and heel-to-shin performed accurately bilaterally. Gait and Station: Arises from chair without difficulty. Stance is normal. Gait demonstrates normal stride length and with mild imbalance especially with turns.  Mild difficulty with tandem walk.  Able to heel and toe walk without difficulty. Reflexes: 1+ and symmetric. Toes downgoing.     NIHSS  3 Modified Rankin  1     ASSESSMENT/PLAN: Ann Schmidt is a 64 y.o. 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 Vascular risk factors include HTN, HLD and EtOH use.     1. L thalamic stroke :  a. Residual deficit: Mild right hemiparesis with sensory impairment and mild gait impairment.  Referral placed to neuro rehab PT/OT for hopeful further recovery  b. continue  aspirin 81 mg daily for secondary stroke prevention.  c. Intolerant to high-dose statin (see #3) d. Close PCP follow up for aggressive stroke risk factor management  2. HTN: BP goal <130/90.  Stable today.  Monitored by PCP 3. HLD: LDL goal <70. Recent LDL 165.  Repeat lipid panel today and if LDL remains above 70, will recommend restarting atorvastatin at a lower dose or change statin therapy   Follow up in 3 months or call earlier if needed   I spent 45 minutes of face-to-face and non-face-to-face time with patient.  This included previsit chart review including recent hospitalization pertinent progress notes, lab work and imaging, lab review, study review, order entry, electronic health record documentation, patient education regarding recent stroke, residual deficits, importance of managing stroke risk factors and answered all questions to patient satisfaction   Ihor Austin, [facility]  [facility] [address] [place], [place] [address]  Phone [phone] Fax [phone] Note: This document was prepared with digital dictation and possible smart phrase technology. Any transcriptional errors that result from this process are unintentional.

## 2020-01-09 ENCOUNTER — Other Ambulatory Visit: Payer: Self-pay | Admitting: Adult Health

## 2020-01-09 LAB — LIPID PANEL
Chol/HDL Ratio: 4.1 ratio (ref 0.0–4.4)
Cholesterol, Total: 199 mg/dL (ref 100–199)
HDL: 49 mg/dL (ref >39)
LDL Chol Calc (NIH): 131 mg/dL — ABNORMAL HIGH (ref 0–99)
Triglycerides: 103 mg/dL (ref 0–149)
VLDL Cholesterol Cal: 19 mg/dL (ref 5–40)

## 2020-01-09 MED ORDER — ROSUVASTATIN CALCIUM 40 MG PO TABS: 40.0000 mg | ORAL_TABLET | Freq: Every day | ORAL | 3 refills | Status: DC

## 2020-01-10 ENCOUNTER — Telehealth: Payer: Self-pay

## 2020-01-10 NOTE — Telephone Encounter (Signed)
Attempted to call the patient without success.  LM on the VM for the patient to call back re: recent results.  **If the patient calls back please connect the call to [place] or myself so we can relay the message below from Ihor Austin NP

## 2020-01-10 NOTE — Telephone Encounter (Signed)
-----   Message from Ann Austin, NP sent at 01/09/2020  1:00 PM EDT ----- Please advise patient that recent LDL above goal of 70 at 131.  Recommend initiating Crestor 40 mg daily as this type of statin usually has less side effects than atorvastatin

## 2020-01-10 NOTE — Telephone Encounter (Signed)
Pt was notified of the message below. She verbalized understanding

## 2020-01-27 ENCOUNTER — Ambulatory Visit: Payer: 59 | Admitting: Occupational Therapy

## 2020-01-27 ENCOUNTER — Ambulatory Visit: Payer: 59 | Attending: Adult Health

## 2020-01-27 ENCOUNTER — Other Ambulatory Visit: Payer: Self-pay

## 2020-01-27 DIAGNOSIS — M25511 Pain in right shoulder: Secondary | ICD-10-CM | POA: Diagnosis present

## 2020-01-27 DIAGNOSIS — R2689 Other abnormalities of gait and mobility: Secondary | ICD-10-CM | POA: Diagnosis present

## 2020-01-27 DIAGNOSIS — R278 Other lack of coordination: Secondary | ICD-10-CM | POA: Diagnosis present

## 2020-01-27 DIAGNOSIS — I69351 Hemiplegia and hemiparesis following cerebral infarction affecting right dominant side: Secondary | ICD-10-CM | POA: Diagnosis present

## 2020-01-27 DIAGNOSIS — R2681 Unsteadiness on feet: Secondary | ICD-10-CM | POA: Diagnosis not present

## 2020-01-27 DIAGNOSIS — R208 Other disturbances of skin sensation: Secondary | ICD-10-CM | POA: Insufficient documentation

## 2020-01-27 DIAGNOSIS — M6281 Muscle weakness (generalized): Secondary | ICD-10-CM | POA: Insufficient documentation

## 2020-01-27 DIAGNOSIS — R41840 Attention and concentration deficit: Secondary | ICD-10-CM | POA: Diagnosis present

## 2020-01-27 NOTE — Therapy (Signed)
[facility] [address] [place], [place], [address] Phone: [phone]   Fax:  [phone]  Occupational Therapy Evaluation  Patient Details  Name: Ann Schmidt MRN: [id number] Date of Birth: [birth year] Referring Provider (OT): Ihor Austin   Encounter Date: 01/27/2020   OT End of Session - 01/27/20 1355    Visit Number 1    Number of Visits 9    Date for OT Re-Evaluation 02/24/20    Authorization Type [employer] P[employer]    Authorization Time Period --    OT Start Time 1315    OT Stop Time 1400    OT Time Calculation (min) 45 min    Activity Tolerance Patient tolerated treatment well    Behavior During Therapy [facility] for tasks assessed/performed;Restless           No past medical history on file.  No past surgical history on file.  There were no vitals filed for this visit.   Subjective Assessment - 01/27/20 1321    Subjective  Pt reports no pain but "heaviness and tightness" in RUE. Pt reports feeling better when using weights and doing push ups in RUE.    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 Yes    Pain Score 4     Pain Location Arm    Pain Orientation Right    Pain Descriptors / Indicators Heaviness    Pain Type Acute pain    Pain Onset More than a month ago    Pain Frequency Constant    Pain Relieving Factors exercises             [facility] OT Assessment - 01/27/20 1305      Assessment   Medical Diagnosis L Thalamic infarct CVA     Referring Provider (OT) Ihor Austin    Onset Date/Surgical Date 11/30/19    Hand Dominance Right      Precautions   Precautions Fall      Balance Screen   Has the patient fallen in the past 6 months No      Home  Environment   Family/patient expects to be discharged to: Private residence    Living Arrangements Children   has foster children as of 1  month ago   Type of Home Aartment    Home Access Stairs   3 flights of stairs - reports easier going up   Bathroom Shower/Tub Tub/Shower unit    [employer] Yes      Prior Function   Level of Independence Independent    Vocation Full time employment    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    Leisure watch movies, exercise, go shopping, dating      ADL   Eating/Feeding Modified independent    Grooming Modified independent    Upper Body Bathing Modified independent    Lower Body Bathing Modified independent    Upper Body Dressing Independent    Lower Body Dressing Modified independent    Toilet Transfer Modified independent    Toileting - Clothing Manipulation Modified independent    Toileting -  Hygiene Modified Independent    Tub/Shower Transfer Modified independent      IADL   Prior Level of Function Shopping independent    Shopping Takes care of all shopping needs independently   foster children help with heavier items  Prior Level of Function Light Housekeeping independent    Light Housekeeping Maintains house alone or with occasional assistance   vacuuming still difficult   Prior Level of Function Meal Prep independent    Meal Prep Plans, prepares and serves adequate meals independently   some difficulty with chopping/cutting   Prior Level of Function Community Mobility independent    Community Mobility Drives own vehicle    Prior Level of Function Medication Managment independent    Medication Management Is responsible for taking medication in correct dosages at correct time    Prior Level of Function Financial Management independent    Development worker, communityinancial Management Manages financial matters independently (budgets, writes checks, pays rent, bills goes to bank), collects and keeps track of income      Written Expression   Dominant Hand Right    Written Experience Within Functional Limits   reports about 90% of prior level      Vision - History   Baseline Vision No visual deficits    Additional Comments pt reports no changes      Vision Assessment   Comment pt reports no changes      Cognition   Overall Cognitive Status No family/caregiver present to determine baseline cognitive functioning    Behaviors Restless;Impulsive    Cognition Comments hyperverbal       Observation/Other Assessments   Focus on Therapeutic Outcomes (FOTO)  70      Sensation   Light Touch Appears Intact    Hot/Cold Appears Intact   almost "95%" the same bilaterally     Coordination   Finger Nose Finger Test intact    9 Hole Peg Test Right;Left    Right 9 Hole Peg Test 20.13    Left 9 Hole Peg Test 19.35    Box and Blocks R 69 L 63      ROM / Strength   AROM / PROM / Strength AROM;Strength      AROM   Overall AROM  Within functional limits for tasks performed      Strength   Overall Strength Within functional limits for tasks performed      Hand Function   Right Hand Grip (lbs) 39.9    Left Hand Grip (lbs) 48.9                           OT Education - 01/27/20 1346    Education Details Education on role and purpose of OT    Person(s) Educated Patient    Methods Explanation    Comprehension Verbalized understanding               OT Long Term Goals - 01/27/20 1420      OT LONG TERM GOAL #1   Title Pt will be independent with HEP 02/24/20    Time 4    Period Weeks    Status New    Target Date 02/24/20      OT LONG TERM GOAL #2   Title Pt will demonstrate increased grip strength in dominant RUE by 8 lbs for increase in daily functioning and increased ease with ADLs and IADLs    Baseline RUE 39.9 (LUE 48.9)    Time 4    Period Weeks    Status New      OT LONG TERM GOAL #3   Title Pt will report increased ease and return to prior level of functioning for home management (i.e. vacuuming, scrubbing bathroom, etc)  Time 4    Period Weeks    Status New      OT LONG TERM GOAL #4    Title Pt will perform work simulated tasks with appropriate physical and cognitive skills with 95% accuracy    Baseline reports difficulty with rotary cutter    Time 4    Period Weeks    Status New      OT LONG TERM GOAL #5   Title Pt will perform plan and complete a 2 course meal for herself and children and report completing with increased ease and with mod I    Baseline reports difficulty cutting vegetables etc    Time 4    Period Weeks    Status New                 Plan - 01/27/20 1300    Clinical Impression Statement Pt is a 64  year old female that presents to neuro OPOT s/p CVA L thalamic lacunar infarct. Pt presenting with RUE AND RLE sensory and motor deficits affecting balance with all ADLs. Pt with decreased awareness into deficits at this time. 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 may discharge early if progressing   OT Treatment/Interventions Self-care/ADL training;Patient/family education;DME and/or AE instruction;Therapeutic activities;Therapeutic exercise;Neuromuscular education;Manual Therapy;Energy conservation;Cognitive remediation/compensation    Plan simple  cooking, grip strength HEP RUE    Consulted and Agree with Plan of Care Patient           Patient will benefit from skilled therapeutic intervention in order to improve the following deficits and impairments:   Body Structure / Function / Physical Skills: ADL, Pain, GMC, Decreased knowledge of use of DME, Strength, Proprioception, UE functional use, IADL, Sensation       Visit Diagnosis: Hemiplegia and hemiparesis following cerebral infarction affecting right dominant side (HCC) - Plan: Ot plan of care cert/re-cert  Muscle weakness (generalized) - Plan: Ot plan of care cert/re-cert  Other lack of coordination - Plan: Ot plan of care cert/re-cert  Acute pain of right shoulder - Plan: Ot plan of care cert/re-cert  Other disturbances of skin sensation - Plan: Ot plan of care cert/re-cert  Attention and concentration deficit - Plan: Ot plan of care cert/re-cert    Problem List Patient Active Problem List   Diagnosis Date Noted  . Cerebrovascular accident (CVA) (HCC) 12/01/2019  . Essential hypertension 12/01/2019  . Paresthesia 12/01/2019  . Acute CVA (cerebrovascular accident) (HCC) 12/01/2019  . Stroke-like symptoms 11/30/2019  . Menopausal hot flushes 10/03/2017  . Menopause 10/03/2017    Junious Dresser MOT, OTR/L  01/27/2020, 2:33 PM  [facility] [address] [place], [place], [address] Phone: [phone]   Fax:  [phone]  Name: Ann Schmidt MRN: [id number] Date of Birth: [birth year]

## 2020-01-27 NOTE — Therapy (Signed)
[facility] [address] [place], [place], [address] Phone: [phone]   Fax:  [phone]  Physical Therapy Evaluation  Patient Details  Name: Ann Schmidt MRN: [id number] Date of Birth: 1955/09/03 Referring Provider (PT): Ihor Austin, NP   Encounter Date: 01/27/2020   PT End of Session - 01/27/20 1436    Visit Number 1    Number of Visits 4    Date for PT Re-Evaluation 03/27/20   POC for 3 weeks, Cert for 60 days   Authorization Type [employer]    PT Start Time 1401    PT Stop Time 1439    PT Time Calculation (min) 38 min    Equipment Utilized During Treatment Gait belt    Activity Tolerance Patient tolerated treatment well    Behavior During Therapy [facility] for tasks assessed/performed           History reviewed. No pertinent past medical history.  History reviewed. No pertinent surgical history.  There were no vitals filed for this visit.    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 Patient that she has improved significantly since discharge, reports she feels she is getting back to normal. Patient reports that her inner thigh on the RLE feels heaviness. Patient is currently ambulating without an AD. Patient does report some numbness on the 4th and 5th toe. No pain reports per patient. Patient reports that she feels like RLE gets heavy and fatigued quickly. Patient reports she is walking 3 1/2 miles on the treadmill.    Pertinent History HTN    Currently in Pain? No/denies              [facility] PT Assessment - 01/27/20 1411      Assessment   Medical Diagnosis L Thalamic infarct CVA     Referring Provider (PT) Ihor Austin, NP    Onset Date/Surgical Date 11/30/19    Hand Dominance Right    Next MD Visit 05/13/2020      Precautions   Precautions Fall      Balance Screen   Has the patient fallen in the past 6 months No     Has the patient had a decrease in activity level because of a fear of falling?  No    Is the patient reluctant to leave their home because of a fear of falling?  No      Home Environment   Living Environment Private residence    Living Arrangements Children    Available Help at Discharge Family   Patient is foster parent.   Type of Home Apartment    Home Access Stairs to enter    Entrance Stairs-Number of Steps --   three flight of stairs   Entrance Stairs-Rails Right;Left    Home Layout Other (Comment)    Home Equipment None    Additional Comments Live on the 3rd floor of apartment complex, three flights of stairs. Rails are present at the stairs.       Prior Function   Level of Independence Independent    Vocation Full time employment    Psychologist, counselling; computerer work; own [employer]      Cognition   Overall Cognitive Status Within Functional Limits for tasks assessed    Behaviors Restless;Impulsive      Observation/Other Assessments   Focus on Therapeutic Outcomes (FOTO)  70/100  Sensation   Light Touch Appears Intact    Additional Comments intact sensation; reports slightly dimished on RLE      Posture/Postural Control   Posture/Postural Control No significant limitations      Tone   Assessment Location Right Lower Extremity      ROM / Strength   AROM / PROM / Strength AROM;Strength      AROM   Overall AROM  Within functional limits for tasks performed      Strength   Overall Strength Deficits    Overall Strength Comments WFL for Bilateral Hips, 4/5 on knee flex/extension, 4+ on bilateral DF's      Transfers   Transfers Sit to Stand;Stand to Sit    Sit to Stand 6: Modified independent (Device/Increase time)    Five time sit to stand comments  10.59 secs w/o UE support    Stand to Sit 6: Modified independent (Device/Increase time)      Ambulation/Gait   Ambulation/Gait Yes    Ambulation/Gait Assistance 6:  Modified independent (Device/Increase time)    Ambulation Distance (Feet) 115 Feet    Assistive device None    Gait Pattern Within Functional Limits    Ambulation Surface Level;Indoor    Gait velocity 8.62 secs = 3.8 ft/sec    Stairs Yes    Stairs Assistance 5: Supervision    Stairs Assistance Details (indicate cue type and reason) ascend/descend stairs with no rails and reciprocal pattern. mild balance deficit noted with descending, patient also reporting not feeling as comfortable with descending stairs at this time.     Stair Management Technique No rails;Alternating pattern;Forwards    Number of Stairs 8    Height of Stairs 6      High Level Balance   High Level Balance Activites Other (comment)    High Level Balance Comments Completed M-CTSIB with patient able to hold situation 1-3 for full 30 seconds, able to hold situation 4: 25 secs. increased sway with vision removed      Functional Gait  Assessment   Gait assessed  Yes    Gait Level Surface Walks 20 ft in less than 5.5 sec, no assistive devices, good speed, no evidence for imbalance, normal gait pattern, deviates no more than 6 in outside of the 12 in walkway width.    Change in Gait Speed Able to smoothly change walking speed without loss of balance or gait deviation. Deviate no more than 6 in outside of the 12 in walkway width.    Gait with Horizontal Head Turns Performs head turns smoothly with no change in gait. Deviates no more than 6 in outside 12 in walkway width    Gait with Vertical Head Turns Performs head turns with no change in gait. Deviates no more than 6 in outside 12 in walkway width.    Gait and Pivot Turn Pivot turns safely within 3 sec and stops quickly with no loss of balance.    Step Over Obstacle Is able to step over 2 stacked shoe boxes taped together (9 in total height) without changing gait speed. No evidence of imbalance.    Gait with Narrow Base of Support Ambulates 7-9 steps.    Gait with Eyes Closed  Walks 20 ft, uses assistive device, slower speed, mild gait deviations, deviates 6-10 in outside 12 in walkway width. Ambulates 20 ft in less than 9 sec but greater than 7 sec.    Ambulating Backwards Walks 20 ft, no assistive devices, good speed, no evidence for  imbalance, normal gait    Steps Alternating feet, no rail.    Total Score 28    FGA comment: 28 = Low Fall Risk      RLE Tone   RLE Tone Within Functional Limits                      Objective measurements completed on examination: See above findings.               PT Education - 01/27/20 1411    Education Details Educated on POC/evaluation findings    Person(s) Educated Patient    Methods Explanation    Comprehension Verbalized understanding            PT Short Term Goals - 01/27/20 1441      PT SHORT TERM GOAL #1   Title = LTGs             PT Long Term Goals - 01/27/20 1442      PT LONG TERM GOAL #1   Title Patient will be independent with final HEP for strengthening/balance (ALL LTGs Due: 02/24/20)    Baseline no HEP established    Time 3    Period Weeks    Status New    Target Date 02/24/20   pushed out 1 week due to scheduling     PT LONG TERM GOAL #2   Title Patient will demo ability to hold situation 4 of M-CTSIB for full 30 seconds to demonstrate improved balance    Baseline 25 secs    Time 3    Period Weeks    Status New      PT LONG TERM GOAL #3   Title Patient will demo ability to ambualte > 1000 ft various outdoor surfaces without AD and no instances of imbalance to demo improved community mobility    Baseline TBA    Time 3    Period Weeks    Status New      PT LONG TERM GOAL #4   Title Patient will demo ability to ascend/descend 12 stairs without rails alternating pattern and no imbalance to demo improved entry into home    Baseline mild balance deficits with descending    Time 3    Period Weeks    Status New                  Plan - 01/27/20 1443      Clinical Impression Statement Patient is a 64 y.o. female that was referred to Neuro OPPT services for L Thalamic CVA. Patient's PMH includes the following: HTN. Upon evaluation patient presents with the following impairments: altered sensation, decreased strength, decreased balance, and decreased endurance. Patient scoring 28/30 today on FGA, demonstrating low fall risk. Patient is also ambulating at 3/8 ft/sec w/o AD, demonstrating community ambulator. Mild balance deficits noted during session. Short POC expected, however patient will benefit from skilled PT services to establish HEP and address impairments noted.    Personal Factors and Comorbidities Comorbidity 1    Comorbidities HTN    Examination-Activity Limitations Stairs;Locomotion Level    Examination-Participation Restrictions Community Activity;Driving;Yard Work    Stability/Clinical Decision Making Stable/Uncomplicated    Optometrist Low    Rehab Potential Excellent    PT Frequency 1x / week    PT Duration 3 weeks    PT Treatment/Interventions ADLs/Self Care Home Management;Electrical Stimulation;Moist Heat;Cryotherapy;DME Instruction;Gait training;Stair training;Functional mobility training;Therapeutic activities;Therapeutic exercise;Balance training;Neuromuscular re-education;Patient/family education;Orthotic Fit/Training;Manual techniques;Passive range of  motion;Vestibular    PT Next Visit Plan Initiate High Level Balance and Strengthening HEP.    Recommended Other Services Occupational Therapy    Consulted and Agree with Plan of Care Patient           Patient will benefit from skilled therapeutic intervention in order to improve the following deficits and impairments:  Decreased balance, Decreased endurance, Decreased strength, Decreased safety awareness, Decreased activity tolerance, Difficulty walking, Impaired sensation  Visit Diagnosis: Unsteadiness on feet  Muscle weakness (generalized)  Other  abnormalities of gait and mobility     Problem List Patient Active Problem List   Diagnosis Date Noted  . Cerebrovascular accident (CVA) (HCC) 12/01/2019  . Essential hypertension 12/01/2019  . Paresthesia 12/01/2019  . Acute CVA (cerebrovascular accident) (HCC) 12/01/2019  . Stroke-like symptoms 11/30/2019  . Menopausal hot flushes 10/03/2017  . Menopause 10/03/2017    Tempie Donning, PT, DPT 01/27/2020, 6:33 PM  [facility] [address] [place], [place], [address] Phone: [phone]   Fax:  [phone]  Name: Ann Schmidt MRN: [id number] Date of Birth: Aug 11, 1955

## 2020-02-12 ENCOUNTER — Ambulatory Visit: Payer: 59 | Attending: Adult Health

## 2020-02-12 ENCOUNTER — Other Ambulatory Visit: Payer: Self-pay

## 2020-02-12 DIAGNOSIS — R2689 Other abnormalities of gait and mobility: Secondary | ICD-10-CM | POA: Insufficient documentation

## 2020-02-12 DIAGNOSIS — I69351 Hemiplegia and hemiparesis following cerebral infarction affecting right dominant side: Secondary | ICD-10-CM | POA: Insufficient documentation

## 2020-02-12 DIAGNOSIS — R278 Other lack of coordination: Secondary | ICD-10-CM | POA: Insufficient documentation

## 2020-02-12 DIAGNOSIS — R2681 Unsteadiness on feet: Secondary | ICD-10-CM | POA: Diagnosis present

## 2020-02-12 DIAGNOSIS — R208 Other disturbances of skin sensation: Secondary | ICD-10-CM | POA: Diagnosis present

## 2020-02-12 DIAGNOSIS — M25511 Pain in right shoulder: Secondary | ICD-10-CM | POA: Diagnosis present

## 2020-02-12 DIAGNOSIS — R41840 Attention and concentration deficit: Secondary | ICD-10-CM | POA: Insufficient documentation

## 2020-02-12 DIAGNOSIS — M6281 Muscle weakness (generalized): Secondary | ICD-10-CM | POA: Diagnosis not present

## 2020-02-12 NOTE — Therapy (Signed)
[facility] [address] [place], [place], [address] Phone: [phone]   Fax:  [phone]  Physical Therapy Treatment  Patient Details  Name: Ann Schmidt MRN: [id number] Date of Birth: 08-17-1955 Referring Provider (PT): Ihor Austin, NP   Encounter Date: 02/12/2020   PT End of Session - 02/12/20 1825    Visit Number 2    Number of Visits 4    Date for PT Re-Evaluation 03/27/20   POC for 3 weeks, Cert for 60 days   Authorization Type [employer]    PT Start Time 0206    PT Stop Time 0245    PT Time Calculation (min) 39 min    Equipment Utilized During Treatment Gait belt    Activity Tolerance Patient tolerated treatment well    Behavior During Therapy [facility] for tasks assessed/performed           History reviewed. No pertinent past medical history.  History reviewed. No pertinent surgical history.  There were no vitals filed for this visit.   Subjective Assessment - 02/12/20 1412    Subjective Pt reports heaviness in the R adductors. Knee and distally are much better. Still numb on th 4th and 5th toes and fingers - starting to get some feeling back so it's getting better. Bunion on inner R toe. Pt reports doing 1 hours workouts 3x/week at home.    Pertinent History HTN    Currently in Pain? Yes    Pain Score 4     Pain Location Foot    Pain Orientation Right                             [facility] Adult PT Treatment/Exercise - 02/12/20 1818      Ambulation/Gait   Ambulation/Gait Yes    Ambulation/Gait Assistance 6: Modified independent (Device/Increase time)    Ambulation Distance (Feet) 1000 Feet    Assistive device None    Gait Pattern Within Functional Limits    Ambulation Surface Outdoor;Unlevel;Grass;Paved      High Level Balance   High Level Balance Activities Head turns;Tandem walking;Marching forwards;Backward walking;Side stepping    High Level Balance Comments Pt completed with  supervision and cues to slow down during marches and focus on slow, controlled motion      Neuro Re-ed    Neuro Re-ed Details  Pt instructed in tossing/catching on airex with narrow BOS x 25 reps with very mild postural sway and no LOB using CGA. Cone tapping in mutliple planes on bosu ball with NBOS with mod instability and requiring min A for safety x 10 reps bil. step-up cone taps x 10 reps bil with CGA and no instability. High-knee marches on airex x20 reps with 3 lb ball hold OH with arms extended.      Exercises   Exercises Other Exercises;Knee/Hip      Knee/Hip Exercises: Stretches   Other Knee/Hip Stretches Adductor/groin stretch for 30 s x 2 reps with pt reporting decreased pain after,      Knee/Hip Exercises: Standing   Forward Lunges 1 set;15 reps    Forward Lunges Limitations Pt with more diffuclty with R leg posteriorly. Pt with mild instability with fwd lunges in grass. Pt cued on proper ROM and mechanics. Pt with 2 lateraly LOB but able to stabilize on her own.    Side Lunges 10 reps;1 set    Side Lunges Limitations No limitaitons    Functional Squat 10  reps;1 set    Functional Squat Limitations Pt completed with supervision and good mechanics.                     PT Short Term Goals - 01/27/20 1441      PT SHORT TERM GOAL #1   Title = LTGs             PT Long Term Goals - 01/27/20 1442      PT LONG TERM GOAL #1   Title Patient will be independent with final HEP for strengthening/balance (ALL LTGs Due: 02/24/20)    Baseline no HEP established    Time 3    Period Weeks    Status New    Target Date 02/24/20   pushed out 1 week due to scheduling     PT LONG TERM GOAL #2   Title Patient will demo ability to hold situation 4 of M-CTSIB for full 30 seconds to demonstrate improved balance    Baseline 25 secs    Time 3    Period Weeks    Status New      PT LONG TERM GOAL #3   Title Patient will demo ability to ambualte > 1000 ft various outdoor  surfaces without AD and no instances of imbalance to demo improved community mobility    Baseline TBA    Time 3    Period Weeks    Status New      PT LONG TERM GOAL #4   Title Patient will demo ability to ascend/descend 12 stairs without rails alternating pattern and no imbalance to demo improved entry into home    Baseline mild balance deficits with descending    Time 3    Period Weeks    Status New                 Plan - 02/12/20 1826    Clinical Impression Statement Today's skilled session focused on higher level dynamic balance and strengthening as well as dynamic gait activities outdoors. Pt most challenged with forward lunges and cone taps on bosu ball. Pt educated on inital HEP. Pt is very motivated and willing to work hard. Will continue to progress towards goals.    Personal Factors and Comorbidities Comorbidity 1    Comorbidities HTN    Examination-Activity Limitations Stairs;Locomotion Level    Examination-Participation Restrictions Community Activity;Driving;Yard Work    Stability/Clinical Decision Making Stable/Uncomplicated    Rehab Potential Excellent    PT Frequency 1x / week    PT Duration 3 weeks    PT Treatment/Interventions ADLs/Self Care Home Management;Electrical Stimulation;Moist Heat;Cryotherapy;DME Instruction;Gait training;Stair training;Functional mobility training;Therapeutic activities;Therapeutic exercise;Balance training;Neuromuscular re-education;Patient/family education;Orthotic Fit/Training;Manual techniques;Passive range of motion;Vestibular    PT Next Visit Plan Reassess fwd lunges. Add reverse lunges and/or weights to fwd lunges. Cont High Level Balance and Strengthening.    Consulted and Agree with Plan of Care Patient           Patient will benefit from skilled therapeutic intervention in order to improve the following deficits and impairments:  Decreased balance, Decreased endurance, Decreased strength, Decreased safety awareness,  Decreased activity tolerance, Difficulty walking, Impaired sensation  Visit Diagnosis: Muscle weakness (generalized)  Other abnormalities of gait and mobility     Problem List Patient Active Problem List   Diagnosis Date Noted  . Cerebrovascular accident (CVA) (HCC) 12/01/2019  . Essential hypertension 12/01/2019  . Paresthesia 12/01/2019  . Acute CVA (cerebrovascular accident) (HCC) 12/01/2019  . Stroke-like symptoms  11/30/2019  . Menopausal hot flushes 10/03/2017  . Menopause 10/03/2017    Isabella Bowens, SPT 02/13/2020, 8:47 AM  [facility] [address] [place], [place], [address] Phone: [phone]   Fax:  [phone]  Name: Janece Laidlaw MRN: [id number] Date of Birth: 05/19/1955

## 2020-02-12 NOTE — Patient Instructions (Signed)
Access Code: [email or web address] URL: [email or web address] Date: 02/12/2020 Prepared by: Elmer Bales  Exercises Walking Forward Lunge - 2 x daily - 7 x weekly - 2 sets - 10 reps Butterfly Groin Stretch - 2 x daily - 7 x weekly - 2 sets - 5 reps - 30 hold Standing March with Alternating [facility] - 2 x daily - 7 x weekly - 2 sets - 20 reps

## 2020-02-19 ENCOUNTER — Other Ambulatory Visit: Payer: Self-pay

## 2020-02-19 ENCOUNTER — Ambulatory Visit: Payer: 59

## 2020-02-19 ENCOUNTER — Telehealth: Payer: Self-pay

## 2020-02-19 VITALS — BP 158/110

## 2020-02-19 DIAGNOSIS — R2681 Unsteadiness on feet: Secondary | ICD-10-CM

## 2020-02-19 DIAGNOSIS — R2689 Other abnormalities of gait and mobility: Secondary | ICD-10-CM

## 2020-02-19 DIAGNOSIS — M6281 Muscle weakness (generalized): Secondary | ICD-10-CM

## 2020-02-19 NOTE — Therapy (Signed)
[facility] [address] [place], [place], [address] Phone: [phone]   Fax:  [phone]  Patient Details  Name: Ann Schmidt MRN: [id number] Date of Birth: 1956-01-02 Referring Provider:  Ihor Austin, NP  Encounter Date: 02/19/2020  Patient Arrived - No Charge  Patient presented to PT appointment reporting no new concerns/complaints. Patient reporting that had MD visit and was prescribed medications for High Blood Pressure at last MD visit. Patient reports that she took BP medications, however lead to side effects including increased tightness in the RUE/RLE. Patient reports that she has stopped taking the medications at this time due to side effects. PT assessed BP seated prior to activity, patient having readings of 162/108. Followed by 3-4 minute rest break: 158/110. Patient reporting no concerning signs/symptoms, and feeling "normal". PT educating on signs/symptoms of CVA and educating that if any of these signs/symptoms occur to go to ED or call 911. PT edcuating that we will not be completing PT session today due to potential further increase in BP with activities. Primary PT called Dr. Juanda Chance office and educated on patient's elevated BP reading. PT also educating patient to reach out to PCP and continue to monitor BP at home.     Tempie Donning, PT, DPT 02/19/2020, 12:55 PM  [place] [facility] [address] [place], [place], [address] Phone: [phone]   Fax:  [phone]

## 2020-02-19 NOTE — Telephone Encounter (Signed)
PT called Dr. Juanda Chance office and spoke with nurse assistant regarding Dr. Earlene Plater elevated blood pressure at PT session today, and patient recently stopping BP medications due to side effects experiencing. BP readings including: 162/108, 158/110. PT informing nurse assistant that therapy was withheld today due to elevated BP, and educated patient on signs/symptoms of Stroke.     Adelfa Koh, PT, DPT

## 2020-02-24 ENCOUNTER — Other Ambulatory Visit: Payer: Self-pay

## 2020-02-24 ENCOUNTER — Ambulatory Visit: Payer: 59 | Admitting: Occupational Therapy

## 2020-02-24 ENCOUNTER — Ambulatory Visit: Payer: 59

## 2020-02-24 ENCOUNTER — Encounter: Payer: Self-pay | Admitting: Occupational Therapy

## 2020-02-24 VITALS — BP 164/119 | HR 77

## 2020-02-24 DIAGNOSIS — R208 Other disturbances of skin sensation: Secondary | ICD-10-CM

## 2020-02-24 DIAGNOSIS — M25511 Pain in right shoulder: Secondary | ICD-10-CM

## 2020-02-24 DIAGNOSIS — M6281 Muscle weakness (generalized): Secondary | ICD-10-CM | POA: Diagnosis not present

## 2020-02-24 DIAGNOSIS — R2689 Other abnormalities of gait and mobility: Secondary | ICD-10-CM

## 2020-02-24 DIAGNOSIS — R2681 Unsteadiness on feet: Secondary | ICD-10-CM

## 2020-02-24 DIAGNOSIS — R278 Other lack of coordination: Secondary | ICD-10-CM

## 2020-02-24 DIAGNOSIS — R41840 Attention and concentration deficit: Secondary | ICD-10-CM

## 2020-02-24 DIAGNOSIS — I69351 Hemiplegia and hemiparesis following cerebral infarction affecting right dominant side: Secondary | ICD-10-CM

## 2020-02-24 NOTE — Therapy (Signed)
[facility] [address] [place], [place], [address] Phone: [phone]   Fax:  [phone]  Patient Details  Name: Ann Schmidt MRN: [id number] Date of Birth: 05-Jan-1956 Referring Provider:  Truett Perna, MD  Encounter Date: 02/24/2020     Visit Arrived - No Charge  Patient's BP elevated throughout Occupational Therapy Treatment session. Patient seated throughout entirety of session. BP at end of OT Session (prior to PT Session):  170/110.  PT educating patient that unable to participate in PT session due to elevated BP reading. Patient is currently working with PCP to determine appropriate medication management. Will plan to return to PT services once patient is able to manage BP to allow for participation.   Tempie Donning, PT, DPT 02/24/2020, 2:17 PM  [place] [facility] [address] [place], [place], [address] Phone: [phone]   Fax:  [phone]

## 2020-02-24 NOTE — Patient Instructions (Addendum)
1. Grip Strengthening (Resistive Putty)   Squeeze putty using thumb and all fingers. Repeat _20___ times. Do __2__ sessions per day.   2. Roll putty into tube on table and pinch between each finger and thumb x 10 reps each. (can do ring and small finger together)     Copyright  VHI. All rights reserved.    Search: Automatic Blood Pressure on Amazon  ScrapbookPens.is

## 2020-02-24 NOTE — Therapy (Signed)
[facility] [address] [place], [place], [address] Phone: [phone]   Fax:  [phone]  Occupational Therapy Treatment  Patient Details  Name: Ann Schmidt MRN: [id number] Date of Birth: 02-20-1956 Referring Provider (OT): Ihor Austin   Encounter Date: 02/24/2020   OT End of Session - 02/24/20 1320    Visit Number 2    Number of Visits 9    Date for OT Re-Evaluation 02/24/20    Authorization Type [employer] PPO/[employer]    OT Start Time 1319    OT Stop Time 1400    OT Time Calculation (min) 41 min    Activity Tolerance Patient tolerated treatment well    Behavior During Therapy [facility] for tasks assessed/performed;Restless           History reviewed. No pertinent past medical history.  History reviewed. No pertinent surgical history.  Vitals:   02/24/20 1322 02/24/20 1328 02/24/20 1344 02/24/20 1357  BP: (!) 169/117 (!) 140/110 (!) 170/110 (!) 164/119  Pulse: 77        Subjective Assessment - 02/24/20 1333    Subjective  Pt reports that her hand and RUE are getting better. Pt reports using 3 lb dumbbells for her aerobic workouts. Pt erports less tightness    Limitations HTN, HLD, EtOH use    Patient Stated Goals to get back to prior stroke and normal work functions (i.e. rotary cutter)    Pain Onset More than a month ago             Pt with elevated blood pressure this day in sitting. Blood pressure initially assessed before beginning of session and read 169/117. Pt reports not taking medication and has stopped taking secondary to side effects. Pt reports elevated blood pressure oculd be from recent travel. Pt reports taking Olive Leaf to manage blood pressure and eating basil and parsley and garlic for holistic approach to managing blood pressure. Education was provided on parameters and management of medications and blood pressure. Pt requires further instruction.    Blood Pressure assessed after  approximately 8-10 minutes with a change from 169/117 to 140/110 with digital blood pressure machine.   Pt reports that her RUE shoulder blade is better and does not report any more tightness.   Pt issued red theraputty for grip strengthening for RUE and after seated exercises with theraputty for approximately 5-8 minutes rechecked blood pressure with manual cuff - read 170/110.   Blood pressure rechecked on automatic cuff per patient request at end of session and read 169/119.            OT Education - 02/24/20 1338    Education Details Education provided on blood pressure and parameters and medication management. Issued red theraputty HEP    Person(s) Educated Patient    Methods Explanation;Demonstration    Comprehension Verbalized understanding;Need further instruction;Returned demonstration            OT Short Term Goals - 01/27/20 1419      OT SHORT TERM GOAL #1   Title --    Time --    Period --    Status --             OT Long Term Goals - 02/24/20 1406      OT LONG TERM GOAL #1   Title Pt will be independent with HEP 02/24/20    Time 4    Period Weeks    Status On-going      OT LONG  TERM GOAL #2   Title Pt will demonstrate increased grip strength in dominant RUE by 8 lbs for increase in daily functioning and increased ease with ADLs and IADLs    Baseline RUE 39.9 (LUE 48.9)    Time 4    Period Weeks    Status On-going      OT LONG TERM GOAL #3   Title Pt will report increased ease and return to prior level of functioning for home management (i.e. 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 Pt with improved RUE use and function. Pt is limited by elevated blood pressure to progress for therapy. Unable to do any activities not in sitting this day.    OT Occupational Profile and History Problem Focused Assessment - Including review of records relating to presenting problem    Occupational performance deficits (Please refer to evaluation for details): IADL's;ADL's;Play;Work    Games developer / Function / Physical Skills ADL;Pain;GMC;Decreased knowledge of use of DME;Strength;Proprioception;UE functional use;IADL;Sensation    Rehab Potential Good    Clinical Decision Making Limited treatment options, no task modification necessary    Comorbidities Affecting Occupational Performance: None    Modification or Assistance to Complete Evaluation  No modification of tasks or assist necessary to complete eval    OT Frequency 2x / week    OT Duration 4 weeks   or 8 visits depending on scheduling. may discharge early if progressing   OT Treatment/Interventions Self-care/ADL training;Patient/family education;DME and/or AE instruction;Therapeutic activities;Therapeutic exercise;Neuromuscular education;Manual Therapy;Energy conservation;Cognitive remediation/compensation    Plan check blood pressure. check on theraputty HEP    Consulted and Agree with Plan of Care Patient           Patient will benefit from skilled therapeutic intervention in order to improve the following deficits and impairments:   Body Structure / Function / Physical Skills: ADL,Pain,GMC,Decreased knowledge of use of DME,Strength,Proprioception,UE functional use,IADL,Sensation       Visit  Diagnosis: Muscle weakness (generalized)  Other abnormalities of gait and mobility  Unsteadiness on feet  Hemiplegia and hemiparesis following cerebral infarction affecting right dominant side (HCC)  Other lack of coordination  Acute pain of right shoulder  Other disturbances of skin sensation  Attention and concentration deficit    Problem List Patient Active Problem List   Diagnosis Date Noted  . Cerebrovascular accident (CVA) (HCC) 12/01/2019  . Essential hypertension 12/01/2019  . Paresthesia 12/01/2019  . Acute CVA (cerebrovascular accident) (HCC) 12/01/2019  . Stroke-like symptoms 11/30/2019  . Menopausal hot flushes 10/03/2017  . Menopause 10/03/2017  Junious Dresser MOT, OTR/L  02/24/2020, 2:24 PM  [facility] [address] [place], [place], [address] Phone: [phone]   Fax:  [phone]  Name: Ann Schmidt MRN: [id number] Date of Birth: 1956-01-26

## 2020-02-26 ENCOUNTER — Ambulatory Visit: Payer: 59 | Admitting: Occupational Therapy

## 2020-02-26 ENCOUNTER — Other Ambulatory Visit: Payer: Self-pay

## 2020-02-26 VITALS — BP 148/111 | HR 67

## 2020-02-26 NOTE — Therapy (Signed)
[facility] [address] [place], [place], [address] Phone: [phone]   Fax:  [phone]  Occupational Therapy Treatment  Patient Details  Name: Ann Schmidt MRN: [id number] Date of Birth: 17-Mar-1955 Referring Provider (OT): Ihor Austin   Encounter Date: 02/26/2020   OT End of Session - 02/26/20 1342    Visit Number 0   arrive no charge   Number of Visits 9    Date for OT Re-Evaluation 02/24/20    Authorization Type [employer] PPO/[employer]    OT Start Time 1323    OT Stop Time 1337    OT Time Calculation (min) 14 min    Activity Tolerance Patient tolerated treatment well    Behavior During Therapy [facility] for tasks assessed/performed;Restless           No past medical history on file.  No past surgical history on file.  Vitals:   02/26/20 1324 02/26/20 1328  BP: (!) 157/113 (!) 148/111  Pulse: 69 67     Subjective Assessment - 02/26/20 1340    Subjective  Pt reported she was rushing around and that her blood pressure is high because she is stressed.    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 No/denies              Pt arrived but had elevated blood pressure. BP assessed with automatic cuff reading 157/113 and again after resting for approximately 3-5 minutes at 148/111. Pt reported being stressed and was not agreeable to working with therapist this day. No charge.                     OT Short Term Goals - 01/27/20 1419      OT SHORT TERM GOAL #1   Title --    Time --    Period --    Status --             OT Long Term Goals - 02/24/20 1406      OT LONG TERM GOAL #1   Title Pt will be independent with HEP 02/24/20    Time 4    Period Weeks    Status On-going      OT LONG TERM GOAL #2   Title Pt will demonstrate increased grip strength in dominant RUE by 8 lbs for  increase in daily functioning and increased ease with ADLs and IADLs    Baseline RUE 39.9 (LUE 48.9)    Time 4    Period Weeks    Status On-going      OT LONG TERM GOAL #3   Title Pt will report increased ease and return to prior level of functioning for home management (i.e. vacuuming, scrubbing bathroom, etc)    Time 4    Period Weeks    Status New      OT LONG TERM GOAL #4   Title Pt will perform work simulated tasks with appropriate physical and cognitive skills with 95% accuracy    Baseline reports difficulty with rotary cutter    Time 4    Period Weeks    Status On-going      OT LONG TERM GOAL #5   Title Pt will perform plan and complete a 2 course meal for herself and children and report completing with increased ease and with mod I  Baseline reports difficulty cutting vegetables etc    Time 4    Period Weeks    Status New                 Plan - 02/26/20 1346    Clinical Impression Statement Pt with elevated blood pressure this day but has begun new medication. Pt was unable to identify new meds and not agreeable to answering questions regarding medication updating in EPIC.    OT Occupational Profile and History Problem Focused Assessment - Including review of records relating to presenting problem    Occupational performance deficits (Please refer to evaluation for details): IADL's;ADL's;Play;Work    Games developer / Function / Physical Skills ADL;Pain;GMC;Decreased knowledge of use of DME;Strength;Proprioception;UE functional use;IADL;Sensation    Rehab Potential Good    Clinical Decision Making Limited treatment options, no task modification necessary    Comorbidities Affecting Occupational Performance: None    Modification or Assistance to Complete Evaluation  No modification of tasks or assist necessary to complete eval    OT Frequency 2x / week    OT Duration 4 weeks   or 8 visits depending on scheduling. may discharge early if progressing   OT  Treatment/Interventions Self-care/ADL training;Patient/family education;DME and/or AE instruction;Therapeutic activities;Therapeutic exercise;Neuromuscular education;Manual Therapy;Energy conservation;Cognitive remediation/compensation    Plan check blood pressure. 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 Cerebrovascular accident (CVA) (HCC) 12/01/2019  . Essential hypertension 12/01/2019  . Paresthesia 12/01/2019  . Acute CVA (cerebrovascular accident) (HCC) 12/01/2019  . Stroke-like symptoms 11/30/2019  . Menopausal hot flushes 10/03/2017  . Menopause 10/03/2017    Junious Dresser MOT, OTR/L  02/26/2020, 1:47 PM   [facility] [address] [place], [place], [address] Phone: [phone]   Fax:  [phone]  Name: Jaemarie Hochberg MRN: [id number] Date of Birth: [birth year]

## 2020-03-02 ENCOUNTER — Ambulatory Visit: Payer: 59 | Admitting: Occupational Therapy

## 2020-03-02 ENCOUNTER — Encounter: Payer: Self-pay | Admitting: Occupational Therapy

## 2020-03-02 ENCOUNTER — Ambulatory Visit: Payer: 59

## 2020-03-02 ENCOUNTER — Other Ambulatory Visit: Payer: Self-pay

## 2020-03-02 ENCOUNTER — Telehealth: Payer: Self-pay

## 2020-03-02 VITALS — BP 148/110

## 2020-03-02 VITALS — BP 153/113 | HR 80

## 2020-03-02 DIAGNOSIS — M25511 Pain in right shoulder: Secondary | ICD-10-CM

## 2020-03-02 DIAGNOSIS — M6281 Muscle weakness (generalized): Secondary | ICD-10-CM

## 2020-03-02 DIAGNOSIS — R278 Other lack of coordination: Secondary | ICD-10-CM

## 2020-03-02 DIAGNOSIS — R2689 Other abnormalities of gait and mobility: Secondary | ICD-10-CM

## 2020-03-02 DIAGNOSIS — I69351 Hemiplegia and hemiparesis following cerebral infarction affecting right dominant side: Secondary | ICD-10-CM

## 2020-03-02 DIAGNOSIS — R41840 Attention and concentration deficit: Secondary | ICD-10-CM

## 2020-03-02 NOTE — Therapy (Addendum)
[facility] [address] [place], [place], [address] Phone: [phone]   Fax:  [phone]  Physical Therapy Treatment- arrived no charge  Patient Details  Name: Ann Schmidt MRN: [id number] Date of Birth: 1955-11-01 Referring Provider (PT): Ihor Austin, NP   Encounter Date: 03/02/2020   PT End of Session - 03/02/20 1403    Visit Number 2   arrived no charge due to BP   Number of Visits 4    Date for PT Re-Evaluation 03/27/20   POC for 3 weeks, Cert for 60 days   Authorization Type [employer]    PT Start Time 0205    PT Stop Time 0220    PT Time Calculation (min) 15 min    Equipment Utilized During Treatment --    Activity Tolerance Patient tolerated treatment well    Behavior During Therapy [facility] for tasks assessed/performed           No past medical history on file.  No past surgical history on file.  Vitals:   03/02/20 1405 03/02/20 1412  BP: (!) 150/115 (!) 148/110     Subjective Assessment - 03/02/20 1403    Subjective Patient reports that bunion on R foot is getting better. Started on a new BP med last week. Not sure of name. She just ordered a home cuff and will start checking as well. No pain, just tightness in right leg. Pt returns to Dr. Regino Schultze on Thursday for another follow-up on BP. PT looked at chart and found was started on labetalol on 12/13.    Pertinent History HTN    Currently in Pain? No/denies    Pain Onset More than a month ago                                     PT Education - 03/02/20 1424    Education Details Pt was instructed to check BP prior to coming to next session and cancel if bottom number >100. Will cancel visit Wednesday until after PCP visit and reschedule to next week.    Person(s) Educated Patient    Methods Explanation    Comprehension Verbalized understanding            PT Short Term Goals - 01/27/20 1441      PT SHORT TERM GOAL #1   Title  = LTGs             PT Long Term Goals - 01/27/20 1442      PT LONG TERM GOAL #1   Title Patient will be independent with final HEP for strengthening/balance (ALL LTGs Due: 02/24/20)    Baseline no HEP established    Time 3    Period Weeks    Status New    Target Date 02/24/20   pushed out 1 week due to scheduling     PT LONG TERM GOAL #2   Title Patient will demo ability to hold situation 4 of M-CTSIB for full 30 seconds to demonstrate improved balance    Baseline 25 secs    Time 3    Period Weeks    Status New      PT LONG TERM GOAL #3   Title Patient will demo ability to ambualte > 1000 ft various outdoor surfaces without AD and no instances of imbalance to demo improved community mobility    Baseline TBA    Time 3  Period Weeks    Status New      PT LONG TERM GOAL #4   Title Patient will demo ability to ascend/descend 12 stairs without rails alternating pattern and no imbalance to demo improved entry into home    Baseline mild balance deficits with descending    Time 3    Period Weeks    Status New                 Plan - 03/02/20 1426    Clinical Impression Statement Session witheld due to elevated BP again. Pt following up with PCP again on Thursday so will cancel Wednesday visit and reschedule 1 visit for next week. Pt was advised not to come if checks prior to coming and BP>100 on bottom number. Pt wrote down readings on her log sheet to bring to doctor.    Personal Factors and Comorbidities Comorbidity 1    Comorbidities HTN    Examination-Activity Limitations Stairs;Locomotion Level    Examination-Participation Restrictions Community Activity;Driving;Yard Work    Stability/Clinical Decision Making Stable/Uncomplicated    Rehab Potential Excellent    PT Frequency 1x / week    PT Duration 3 weeks    PT Treatment/Interventions ADLs/Self Care Home Management;Electrical Stimulation;Moist Heat;Cryotherapy;DME Instruction;Gait training;Stair  training;Functional mobility training;Therapeutic activities;Therapeutic exercise;Balance training;Neuromuscular re-education;Patient/family education;Orthotic Fit/Training;Manual techniques;Passive range of motion;Vestibular    PT Next Visit Plan Check BP as have had to hold last 3 times due to elevated BP. Was seeing PCP again Thursday 12/23. If still issues may want to d/c or hold. Reassess fwd lunges. Add reverse lunges and/or weights to fwd lunges. Cont High Level Balance and Strengthening.    Consulted and Agree with Plan of Care Patient           Patient will benefit from skilled therapeutic intervention in order to improve the following deficits and impairments:  Decreased balance,Decreased endurance,Decreased strength,Decreased safety awareness,Decreased activity tolerance,Difficulty walking,Impaired sensation  Visit Diagnosis: Muscle weakness (generalized)     Problem List Patient Active Problem List   Diagnosis Date Noted  . Cerebrovascular accident (CVA) (HCC) 12/01/2019  . Essential hypertension 12/01/2019  . Paresthesia 12/01/2019  . Acute CVA (cerebrovascular accident) (HCC) 12/01/2019  . Stroke-like symptoms 11/30/2019  . Menopausal hot flushes 10/03/2017  . Menopause 10/03/2017    Ronn Melena, PT, DPT, NCS 03/02/2020, 2:32 PM  [facility] [address] [place], [place], [address] Phone: [phone]   Fax:  [phone]  Name: Ann Schmidt MRN: [id number] Date of Birth: 03-Nov-1955

## 2020-03-02 NOTE — Therapy (Signed)
[facility] [address] [place], [place], [address] Phone: [phone]   Fax:  [phone]  Occupational Therapy Treatment  Patient Details  Name: Ann Schmidt MRN: [id number] Date of Birth: January 23, 1956 Referring Provider (OT): Ihor Austin   Encounter Date: 03/02/2020   OT End of Session - 03/02/20 1445    Visit Number 3    Number of Visits 9    Date for OT Re-Evaluation 02/24/20    Authorization Type [employer] PPO/AARP    OT Start Time 1445    OT Stop Time 1516   blood pressure went up. needed to end session   OT Time Calculation (min) 31 min    Activity Tolerance Patient tolerated treatment well    Behavior During Therapy [facility] for tasks assessed/performed;Restless           History reviewed. No pertinent past medical history.  History reviewed. No pertinent surgical history.  Vitals:   03/02/20 1446 03/02/20 1510  BP: (!) 150/99 (!) 153/113  Pulse: 72 80     Subjective Assessment - 03/02/20 1448    Subjective  Things are getting better. We are trying to figure out my blood pressure.    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 No/denies                        OT Treatments/Exercises (OP) - 03/02/20 1449      ADLs   ADL Comments Education provided on blood pressure and management. Pt has no symptoms.      Exercises   Exercises Hand      Fine Motor Coordination (Hand/Wrist)   Fine Motor Coordination In hand manipuation training;Small Pegboard;Manipulation of small objects    In Hand Manipulation Training     Small Pegboard with RUE - in hand manipulation with obtaining 8 small pegs in palm and translating pegs one at a time to fingertips with min drops and increased time    Manipulation of small objects                     OT Short Term Goals - 01/27/20 1419       OT SHORT TERM GOAL #1   Title --    Time --    Period --    Status --             OT Long Term Goals - 03/02/20 1456      OT LONG TERM GOAL #1   Title Pt will be independent with HEP 02/24/20    Time 4    Period Weeks    Status On-going      OT LONG TERM GOAL #2   Title Pt will demonstrate increased grip strength in dominant RUE by 8 lbs for increase in daily functioning and increased ease with ADLs and IADLs    Baseline RUE 39.9 (LUE 48.9)    Time 4    Period Weeks    Status Achieved   RUE 56.6     OT LONG TERM GOAL #3   Title Pt will report increased ease and return to prior level of functioning for home management (i.e. 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 Pt is seeing primary care physician on Thursday and following up regarding high blood pressure and medication management.    OT Occupational Profile and History Problem Focused Assessment - Including review of records relating to presenting problem    Occupational performance deficits (Please refer to evaluation for details): IADL's;ADL's;Play;Work    Games developer / Function / Physical Skills ADL;Pain;GMC;Decreased knowledge of use of DME;Strength;Proprioception;UE functional use;IADL;Sensation    Rehab Potential Good    Clinical Decision Making Limited  treatment options, no task modification necessary    Comorbidities Affecting Occupational Performance: None    Modification or Assistance to Complete Evaluation  No modification of tasks or assist necessary to complete eval    OT Frequency 2x / week    OT Duration 4 weeks   or 8 visits depending on scheduling. may discharge early if progressing   OT Treatment/Interventions Self-care/ADL training;Patient/family education;DME and/or AE instruction;Therapeutic activities;Therapeutic exercise;Neuromuscular education;Manual Therapy;Energy conservation;Cognitive remediation/compensation    Plan check blood pressure. 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 Cerebrovascular accident (CVA) (HCC) 12/01/2019  . Essential hypertension 12/01/2019  . Paresthesia 12/01/2019  . Acute CVA (cerebrovascular accident) (HCC) 12/01/2019  . Stroke-like symptoms 11/30/2019  . Menopausal hot flushes 10/03/2017  . Menopause 10/03/2017    Junious Dresser MOT, OTR/L  03/02/2020, 3:16 PM  [facility] [address] [place], [place], [address] Phone: [phone]   Fax:  [phone]  Name: Ann Schmidt MRN: [id number] Date of Birth: May 04, 1955

## 2020-03-02 NOTE — Telephone Encounter (Signed)
Started in error

## 2020-03-04 ENCOUNTER — Ambulatory Visit: Payer: 59 | Admitting: Occupational Therapy

## 2020-03-04 ENCOUNTER — Ambulatory Visit: Payer: 59

## 2020-03-09 ENCOUNTER — Ambulatory Visit: Payer: 59 | Admitting: Occupational Therapy

## 2020-03-11 ENCOUNTER — Ambulatory Visit: Payer: 59

## 2020-03-11 ENCOUNTER — Ambulatory Visit: Payer: 59 | Admitting: Occupational Therapy

## 2020-05-13 ENCOUNTER — Ambulatory Visit: Payer: PRIVATE HEALTH INSURANCE | Admitting: Adult Health

## 2020-05-18 ENCOUNTER — Other Ambulatory Visit: Payer: Self-pay

## 2020-05-18 ENCOUNTER — Encounter: Payer: Self-pay | Admitting: Adult Health

## 2020-05-18 ENCOUNTER — Ambulatory Visit (INDEPENDENT_AMBULATORY_CARE_PROVIDER_SITE_OTHER): Payer: BC Managed Care – PPO | Admitting: Adult Health

## 2020-05-18 VITALS — BP 142/100 | HR 73 | Ht 62.0 in | Wt 119.0 lb

## 2020-05-18 DIAGNOSIS — I6381 Other cerebral infarction due to occlusion or stenosis of small artery: Secondary | ICD-10-CM

## 2020-05-18 DIAGNOSIS — I639 Cerebral infarction, unspecified: Secondary | ICD-10-CM | POA: Diagnosis not present

## 2020-05-18 DIAGNOSIS — R29818 Other symptoms and signs involving the nervous system: Secondary | ICD-10-CM

## 2020-05-18 NOTE — Progress Notes (Signed)
I agree with the above plan 

## 2020-05-18 NOTE — Progress Notes (Signed)
[facility] [address] [place]. [place] [address] [phone]       STROKE FOLLOW UP NOTE  [name] Date of Birth:  07-21-1955 Medical Record Number:  [id number]   Reason for Referral: stroke follow up    SUBJECTIVE:   CHIEF COMPLAINT:  Chief Complaint  Patient presents with  . Follow-up    RM 14 alone Pt is well, no complaints     HPI:   Today, 05/18/2020, Ann Schmidt returns for stroke follow-up  Reports residual slight right foot and arm heaviness sensation but overall greatly improved compared to prior visit Denies new stroke/TIA symptoms  Compliant on aspirin 81 mg daily -denies side effects Recent LDL 193 - PCP initiated pravastatin- plans on repeating lipid panel with PCP around June Blood pressure today 142/100 -routinely monitors at home and reports average 110/70-90s  No further concerns at this time     History provided for reference purposes only Initial visit 01/08/2020 JM: Ann Schmidt is being seen for hospital follow-up unaccompanied.  She reports residual deficits of right arm numbness/tighness and right toe numbness but overall greatly improving.  She denies residual weakness or imbalance.  She has gradually returned back to her normal activities including work and driving without difficulty.  She unfortunately did not receive home health therapies as her insurance did not cover home health therapies.  She is questioning possibility of outpatient therapy. Denies new or worsening stroke/TIA symptoms.  She remains on aspirin 81 mg daily without bleeding or bruising.  Only able to take Plavix for 3 days due to reports of diarrhea and after stopping, this resolved.  She had difficulty tolerating atorvastatin due to myalgias which resolved after discontinuing.  Blood pressure today 141/94.  She also self discontinued irbesartan due to worsening muscular pain.  She has been using mix of herbal remedies as well as modified diet and increasing  exercise and blood pressure has been slowly improving per her report.  No further concerns at this time.  Stroke admission 11/30/2019 [name] is a 65 y.o. female with history of hypertension  who presented on 11/30/2019 with right-sided numbness, weakness and difficulty walking.  Personally reviewed hospitalization pertinent progress notes, lab work and imaging with summary provided.  Evaluated by Dr. 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  MRA head/neck unremarkable.  Recommended DAPT for 3 weeks and aspirin alone.  HTN stable.  LDL 165 and initiate atorvastatin 80 mg daily.  No history of DM with A1c 6.0.  Other stroke risk factors include EtOH use but no prior stroke history.  Evaluated by therapy who recommended [facility] PT/OT for residual imbalance, cognitive deficits, decreased awareness and insight into deficits and right hemisensory impairment.  She was discharged home in stable condition on 12/02/2019.  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     MRI left thalamic infarct  MRA head and neck unremarkable  2D Echo EF 60 to 65%  LDL 165  HgbA1c 6.0  UDS negative  VTE prophylaxis -SCDs  No antithrombotic prior to admission, now on aspirin 81 mg daily and clopidogrel 75 mg daily DAPT for 3 weeks and then aspirin alone.  Therapy recommendations: HH PT/OT  Disposition: home      ROS:   14 system review of systems performed and negative with exception of those listed in HPI  PMH: History reviewed. No pertinent past medical history.  PSH:  History reviewed. No pertinent surgical history.  Social History:  Social History   Socioeconomic History  . Marital status: Single    Spouse name: Not on file  . Number of children: Not on file  . Years of education: Not on file  . Highest education level: Not on file  Occupational History  . Not on file  Tobacco Use  .  Smoking status: Never Smoker  . Smokeless tobacco: Never Used  Substance and Sexual Activity  . Alcohol use: Yes    Alcohol/week: 2.0 standard drinks    Types: 2 Glasses of wine per week  . Drug use: Never  . Sexual activity: Not Currently    Birth control/protection: None  Other Topics Concern  . Not on file  Social History Narrative  . Not on file   Social Determinants of Health   Financial Resource Strain: Not on file  Food Insecurity: Not on file  Transportation Needs: Not on file  Physical Activity: Not on file  Stress: Not on file  Social Connections: Not on file  Intimate Partner Violence: Not on file    Family History:  Family History  Problem Relation Age of Onset  . Diabetes Mother     Medications:   Current Outpatient Medications on File Prior to Visit  Medication Sig Dispense Refill  . aspirin EC 81 MG EC tablet Take 1 tablet (81 mg total) by mouth daily. Swallow whole. 30 tablet 2  . labetalol (NORMODYNE) 100 MG tablet Take 100 mg by mouth 2 (two) times daily.     No current facility-administered medications on file prior to visit.    Allergies:   Allergies  Allergen Reactions  . Sulfa Antibiotics Hives  . Sulfamethoxazole-Trimethoprim Hives      OBJECTIVE:  Physical Exam  Vitals:   05/18/20 1243  BP: (!) 142/100  Pulse: 73  Weight: 119 lb (54 kg)  Height: 5\' 2"  (1.575 m)   Body mass index is 21.77 kg/m. No exam data present  Post stroke PHQ 2/9 Depression screen PHQ 2/9 01/08/2020  Decreased Interest 0  Down, Depressed, Hopeless 0  PHQ - 2 Score 0     General: well developed, well nourished, very pleasant middle-aged African-American female, seated, in no evident distress Head: head normocephalic and atraumatic.   Neck: supple with no carotid or supraclavicular bruits Cardiovascular: regular rate and rhythm, no murmurs Musculoskeletal: no deformity Skin:  no rash/petichiae Vascular:  Normal pulses all extremities    Neurologic Exam Mental Status: Awake and fully alert.   Fluent speech and language.  Oriented to place and time. Recent and remote memory intact. Attention span, concentration and fund of knowledge appropriate. Mood and affect appropriate.  Cranial Nerves: Pupils equal, briskly reactive to light. Extraocular movements full without nystagmus. Visual fields full to confrontation. Hearing intact. Facial sensation intact. Face, tongue, palate moves normally and symmetrically.  Motor: Normal bulk and tone. Normal strength in all tested extremity muscles  Sensory.:  Intact light touch, vibratory and pinprick sensation in all tested extremities Coordination: Rapid alternating movements normal in all extremities. Finger-to-nose and heel-to-shin performed accurately bilaterally. Gait and Station: Arises from chair without difficulty. Stance is normal. Gait demonstrates normal stride length and balance without use of assistive device.   Reflexes: 1+ and symmetric. Toes downgoing.        ASSESSMENT/PLAN: Adasha Boehme is a 65 y.o. year old female presented with right-sided numbness, weakness and difficulty walking on 11/30/2019 with stroke work-up revealing left thalamic infarct secondary  to small vessel disease source. Vascular risk factors include HTN, HLD and EtOH use.     1. L thalamic stroke :  a. Residual deficit: Very slight right foot and arm numbness but overall great improvement compared to prior visit.  Discussed typical recovery timeframe post stroke and encouraged continued exercises at home b. continue aspirin 81 mg daily and pravastatin for secondary stroke prevention.  c. Discussed secondary stroke prevention measures and importance of close PCP follow up for aggressive stroke risk factor management  d. HTN: BP goal <130/90.  Stable.  Monitored by PCP e. HLD: LDL goal <70. LDL 131 12/2019 therefore initiated Crestor 40 mg daily as she was intolerant to atorvastatin but unfortunately unable  to tolerate Crestor.  Recent LDL 193 05/06/2020 therefore PCP initiated pravastatin -currently tolerating without side effects and plans on repeat lipid panel 6/PCP    Follow up in 6 months or call earlier if needed   CC:  [facility] provider: Dr. Alphonzo Grieve, Charyl Dancer, MD    I spent 30 minutes of face-to-face and non-face-to-face time with patient.  This included previsit chart review, lab review, study review, order entry, electronic health record documentation, patient education regarding prior stroke including etiology, residual deficits, importance of managing stroke risk factors and answered all other questions to patient satisfaction   Ihor Austin, [facility]  [facility] [address] [place], [place] [address]  Phone [phone] Fax [phone] Note: This document was prepared with digital dictation and possible smart phrase technology. Any transcriptional errors that result from this process are unintentional.

## 2020-05-18 NOTE — Patient Instructions (Signed)
You are recovering well and recommend continued exercise for hopeful ongoing recovery  Continue aspirin 81 mg daily  and pravastatin for secondary stroke prevention  Continue to follow up with PCP regarding cholesterol and blood pressure management  Maintain strict control of hypertension with blood pressure goal below 130/90 and cholesterol with LDL cholesterol (bad cholesterol) goal below 70 mg/dL.       Followup in the future with me in 6 months or call earlier if needed       Thank you for coming to see [place] at [facility]. I hope we have been able to provide you high quality care today.  You may receive a patient satisfaction survey over the next few weeks. We would appreciate your feedback and comments so that we may continue to improve ourselves and the health of our patients.

## 2020-11-18 ENCOUNTER — Ambulatory Visit (INDEPENDENT_AMBULATORY_CARE_PROVIDER_SITE_OTHER): Payer: BC Managed Care – PPO | Admitting: Adult Health

## 2020-11-18 ENCOUNTER — Encounter: Payer: Self-pay | Admitting: Adult Health

## 2020-11-18 VITALS — BP 123/90 | HR 90 | Ht 62.0 in | Wt 114.0 lb

## 2020-11-18 DIAGNOSIS — R7303 Prediabetes: Secondary | ICD-10-CM | POA: Diagnosis not present

## 2020-11-18 DIAGNOSIS — E785 Hyperlipidemia, unspecified: Secondary | ICD-10-CM

## 2020-11-18 DIAGNOSIS — I10 Essential (primary) hypertension: Secondary | ICD-10-CM

## 2020-11-18 DIAGNOSIS — I6381 Other cerebral infarction due to occlusion or stenosis of small artery: Secondary | ICD-10-CM

## 2020-11-18 DIAGNOSIS — I639 Cerebral infarction, unspecified: Secondary | ICD-10-CM | POA: Diagnosis not present

## 2020-11-18 NOTE — Progress Notes (Signed)
[facility] [address] [place]. [place] [address] [phone]       STROKE FOLLOW UP NOTE  [name] Date of Birth:  [birth year] Medical Record Number:  [id number]   Reason for Referral: stroke follow up    SUBJECTIVE:   CHIEF COMPLAINT:  Chief Complaint  Patient presents with   Follow-up    RM 3 alone PT is well and stable, doing much better. R side has improved.     HPI:   Today, 11/18/2020, [name] returns for 41-month stroke follow-up.  Overall stable. Doing much better since prior visit. Occasional stiffness in right shoulder but more towards the end of the day. Able to move right hip and knee much better. Has been doing exercises routinely.  Denies new stroke/TIA symptoms.  Compliant on aspirin 81 mg daily and pravastatin 20mg  daily.  Blood pressure today 123/90. Routinely monitors at home which has been stable. Requesting completion of lab work today.  No further concerns at this time.     History provided for reference purposes only Update 05/18/2020 JM: [name] returns for stroke follow-up  Reports residual slight right foot and arm heaviness sensation but overall greatly improved compared to prior visit Denies new stroke/TIA symptoms  Compliant on aspirin 81 mg daily -denies side effects Recent LDL 193 - PCP initiated pravastatin- plans on repeating lipid panel with PCP around June Blood pressure today 142/100 -routinely monitors at home and reports average 110/70-90s  No further concerns at this time  Initial visit 01/08/2020 JM: [name] is being seen for hospital follow-up unaccompanied.  She reports residual deficits of right arm numbness/tighness and right toe numbness but overall greatly improving.  She denies residual weakness or imbalance.  She has gradually returned back to her normal activities including work and driving without difficulty.  She unfortunately did not receive home health therapies as her insurance did not cover  home health therapies.  She is questioning possibility of outpatient therapy. Denies new or worsening stroke/TIA symptoms.  She remains on aspirin 81 mg daily without bleeding or bruising.  Only able to take Plavix for 3 days due to reports of diarrhea and after stopping, this resolved.  She had difficulty tolerating atorvastatin due to myalgias which resolved after discontinuing.  Blood pressure today 141/94.  She also self discontinued irbesartan due to worsening muscular pain.  She has been using mix of herbal remedies as well as modified diet and increasing exercise and blood pressure has been slowly improving per her report.  No further concerns at this time.  Stroke admission 11/30/2019 [name] is a 65 y.o. female with history of hypertension  who presented on 11/30/2019 with right-sided numbness, weakness and difficulty walking.  Personally reviewed hospitalization pertinent progress notes, lab work and imaging with summary provided.  Evaluated by Dr. 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  MRA head/neck unremarkable.  Recommended DAPT for 3 weeks and aspirin alone.  HTN stable.  LDL 165 and initiate atorvastatin 80 mg daily.  No history of DM with A1c 6.0.  Other stroke risk factors include EtOH use but no prior stroke history.  Evaluated by therapy who recommended [facility] PT/OT for residual imbalance, cognitive deficits, decreased awareness and insight into deficits and right hemisensory impairment.  She was discharged home in stable condition on 12/02/2019.  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    MRI left thalamic  infarct MRA head and neck unremarkable 2D Echo EF 60 to 65% LDL 165 HgbA1c 6.0 UDS negative VTE prophylaxis -SCDs No antithrombotic prior to admission, now on aspirin 81 mg daily and clopidogrel 75 mg daily DAPT for 3 weeks and then aspirin alone. Therapy  recommendations: HH PT/OT Disposition: home      ROS:   14 system review of systems performed and negative with exception of those listed in HPI  PMH: History reviewed. No pertinent past medical history.  PSH: History reviewed. No pertinent surgical history.  Social History:  Social History   Socioeconomic History   Marital status: Single    Spouse name: Not on file   Number of children: Not on file   Years of education: Not on file   Highest education level: Not on file  Occupational History   Not on file  Tobacco Use   Smoking status: Never   Smokeless tobacco: Never  Substance and Sexual Activity   Alcohol use: Yes    Alcohol/week: 2.0 standard drinks    Types: 2 Glasses of wine per week   Drug use: Never   Sexual activity: Not Currently    Birth control/protection: None  Other Topics Concern   Not on file  Social History Narrative   Not on file   Social Determinants of Health   Financial Resource Strain: Not on file  Food Insecurity: Not on file  Transportation Needs: Not on file  Physical Activity: Not on file  Stress: Not on file  Social Connections: Not on file  Intimate Partner Violence: Not on file    Family History:  Family History  Problem Relation Age of Onset   Diabetes Mother     Medications:   Current Outpatient Medications on File Prior to Visit  Medication Sig Dispense Refill   labetalol (NORMODYNE) 100 MG tablet Take 100 mg by mouth 2 (two) times daily.     pravastatin (PRAVACHOL) 20 MG tablet Take 20 mg by mouth daily.     No current facility-administered medications on file prior to visit.    Allergies:   Allergies  Allergen Reactions   Sulfa Antibiotics Hives   Sulfamethoxazole-Trimethoprim Hives      OBJECTIVE:  Physical Exam  Vitals:   11/18/20 1544  BP: 123/90  Pulse: 90  Weight: 114 lb (51.7 kg)  Height: 5\' 2"  (1.575 m)   Body mass index is 20.85 kg/m. No results found.   General: well developed, well  nourished, very pleasant middle-aged African-American female, seated, in no evident distress Head: head normocephalic and atraumatic.   Neck: supple with no carotid or supraclavicular bruits Cardiovascular: regular rate and rhythm, no murmurs Musculoskeletal: no deformity Skin:  no rash/petichiae Vascular:  Normal pulses all extremities   Neurologic Exam Mental Status: Awake and fully alert.   Fluent speech and language.  Oriented to place and time. Recent and remote memory intact. Attention span, concentration and fund of knowledge appropriate. Mood and affect appropriate.  Cranial Nerves: Pupils equal, briskly reactive to light. Extraocular movements full without nystagmus. Visual fields full to confrontation. Hearing intact. Facial sensation intact. Face, tongue, palate moves normally and symmetrically.  Motor: Normal bulk and tone. Normal strength in all tested extremity muscles  Sensory.:  Intact light touch, vibratory and pinprick sensation in all tested extremities Coordination: Rapid alternating movements normal in all extremities. Finger-to-nose and heel-to-shin performed accurately bilaterally. Gait and Station: Arises from chair without difficulty. Stance is normal. Gait demonstrates normal stride length and balance without  use of assistive device.   Reflexes: 1+ and symmetric. Toes downgoing.         ASSESSMENT/PLAN: Ann Schmidt is a 65 y.o. 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 Vascular risk factors include HTN, HLD and EtOH use.     L thalamic stroke :  Recovered very well with minimal residual deficit continue aspirin 81 mg daily and pravastatin for secondary stroke prevention.  Discussed secondary stroke prevention measures and importance of close PCP follow up for aggressive stroke risk factor management  HTN: BP goal  <130/90.  Stable.  Monitored by PCP HLD: LDL goal <70.  Currently on pravastatin 20 mg daily.  Intolerant to atorvastatin and rosuvastatin.  Repeat lipid panel today. Pre-DM: A1c goal <7.  Repeat A1c today.    Orders Placed This Encounter  Procedures   Lipid Panel   CMP   Hemoglobin A1c   LDL Cholesterol, Direct       Follow up in 6 months or call earlier if needed   CC:  [facility] provider: Dr. Alphonzo Grieve, Charyl Dancer, MD    I spent 28 minutes of face-to-face and non-face-to-face time with patient.  This included previsit chart review, lab review, study review, order entry, electronic health record documentation, patient education regarding prior stroke including etiology, residual deficits, aggressive stroke risk factor management and importance of managing stroke risk factors and answered all other questions to patient satisfaction   Ihor Austin, [facility]  [facility] [address] [place], [place] [address]  Phone [phone] Fax [phone] Note: This document was prepared with digital dictation and possible smart phrase technology. Any transcriptional errors that result from this process are unintentional.

## 2020-11-18 NOTE — Patient Instructions (Signed)
Continue aspirin 81 mg daily  and pravastatin  for secondary stroke prevention  We will check cholesterol levels today as well as A1c and CMP (comprehensive metabolic panel)  Continue to follow up with PCP regarding cholesterol and blood pressure management  Maintain strict control of hypertension with blood pressure goal below 130/90 and cholesterol with LDL cholesterol (bad cholesterol) goal below 70 mg/dL.       Followup in the future with me in 6 months or call earlier if needed       Thank you for coming to see [place] at [facility]. I hope we have been able to provide you high quality care today.  You may receive a patient satisfaction survey over the next few weeks. We would appreciate your feedback and comments so that we may continue to improve ourselves and the health of our patients.    Cholesterol Content in Foods Cholesterol is a waxy, fat-like substance that helps to carry fat in the blood. The body needs cholesterol in small amounts, but too much cholesterol can cause damage to the arteries and heart. Most people should eat less than 200 milligrams (mg) of cholesterol a day. Foods with cholesterol Cholesterol is found in animal-based foods, such as meat, seafood, and dairy. Generally, low-fat dairy and lean meats have less cholesterol than full-fat dairy and fatty meats. The milligrams of cholesterol per serving (mg per serving) of common cholesterol-containing foods are listed below. Meat and other proteins Egg -- one large whole egg has 186 mg. Veal shank -- 4 oz has 141 mg. Lean ground [place] (93% lean) -- 4 oz has 118 mg. Fat-trimmed lamb loin -- 4 oz has 106 mg. Lean ground beef (90% lean) -- 4 oz has 100 mg. Lobster -- 3.5 oz has 90 mg. Pork loin chops -- 4 oz has 86 mg. Canned salmon -- 3.5 oz has 83 mg. Fat-trimmed beef top loin -- 4 oz has 78 mg. Frankfurter -- 1 frank (3.5 oz) has 77 mg. Crab -- 3.5 oz has 71 mg. Roasted chicken without  skin, white meat -- 4 oz has 66 mg. Light bologna -- 2 oz has 45 mg. Deli-cut [place] -- 2 oz has 31 mg. Canned tuna -- 3.5 oz has 31 mg. Tomasa Blase -- 1 oz has 29 mg. Oysters and mussels (raw) -- 3.5 oz has 25 mg. Mackerel -- 1 oz has 22 mg. Trout -- 1 oz has 20 mg. Pork sausage -- 1 link (1 oz) has 17 mg. Salmon -- 1 oz has 16 mg. Tilapia -- 1 oz has 14 mg. Dairy Soft-serve ice cream --  cup (4 oz) has 103 mg. Whole-milk yogurt -- 1 cup (8 oz) has 29 mg. Cheddar cheese -- 1 oz has 28 mg. American cheese -- 1 oz has 28 mg. Whole milk -- 1 cup (8 oz) has 23 mg. 2% milk -- 1 cup (8 oz) has 18 mg. Cream cheese -- 1 tablespoon (Tbsp) has 15 mg. Cottage cheese --  cup (4 oz) has 14 mg. Low-fat (1%) milk -- 1 cup (8 oz) has 10 mg. Sour cream -- 1 Tbsp has 8.5 mg. Low-fat yogurt -- 1 cup (8 oz) has 8 mg. Nonfat Greek yogurt -- 1 cup (8 oz) has 7 mg. Half-and-half cream -- 1 Tbsp has 5 mg. Fats and oils Cod liver oil -- 1 tablespoon (Tbsp) has 82 mg. Butter -- 1 Tbsp has 15 mg. Lard -- 1 Tbsp has 14 mg. Bacon grease -- 1 Tbsp has  14 mg. Mayonnaise -- 1 Tbsp has 5-10 mg. Margarine -- 1 Tbsp has 3-10 mg. Exact amounts of cholesterol in these foods may vary depending on specific ingredients and brands. Foods without cholesterol Most plant-based foods do not have cholesterol unless you combine them with a food that has cholesterol. Foods without cholesterol include: Grains and cereals. Vegetables. Fruits. Vegetable oils, such as olive, canola, and sunflower oil. Legumes, such as peas, beans, and lentils. Nuts and seeds. Egg whites. Summary The body needs cholesterol in small amounts, but too much cholesterol can cause damage to the arteries and heart. Most people should eat less than 200 milligrams (mg) of cholesterol a day. This information is not intended to replace advice given to you by your health care provider. Make sure you discuss any questions you have with your health care  provider. Document Revised: 06/11/2019 Document Reviewed: 07/22/2019 Elsevier Patient Education  2022 [employer].

## 2020-11-19 ENCOUNTER — Other Ambulatory Visit: Payer: Self-pay | Admitting: Adult Health

## 2020-11-19 ENCOUNTER — Telehealth: Payer: Self-pay

## 2020-11-19 DIAGNOSIS — E785 Hyperlipidemia, unspecified: Secondary | ICD-10-CM

## 2020-11-19 LAB — COMPREHENSIVE METABOLIC PANEL
ALT: 18 IU/L (ref 0–32)
AST: 30 IU/L (ref 0–40)
Albumin/Globulin Ratio: 2 (ref 1.2–2.2)
Albumin: 4.5 g/dL (ref 3.8–4.8)
Alkaline Phosphatase: 78 IU/L (ref 44–121)
BUN/Creatinine Ratio: 17 (ref 12–28)
BUN: 16 mg/dL (ref 8–27)
Bilirubin Total: 0.5 mg/dL (ref 0.0–1.2)
CO2: 28 mmol/L (ref 20–29)
Calcium: 10.3 mg/dL (ref 8.7–10.3)
Chloride: 100 mmol/L (ref 96–106)
Creatinine, Ser: 0.92 mg/dL (ref 0.57–1.00)
Globulin, Total: 2.2 g/dL (ref 1.5–4.5)
Glucose: 90 mg/dL (ref 65–99)
Potassium: 4.7 mmol/L (ref 3.5–5.2)
Sodium: 140 mmol/L (ref 134–144)
Total Protein: 6.7 g/dL (ref 6.0–8.5)
eGFR: 70 mL/min/{1.73_m2} (ref >59)

## 2020-11-19 LAB — LIPID PANEL
Chol/HDL Ratio: 3.1 ratio (ref 0.0–4.4)
Cholesterol, Total: 172 mg/dL (ref 100–199)
HDL: 56 mg/dL (ref >39)
LDL Chol Calc (NIH): 103 mg/dL — ABNORMAL HIGH (ref 0–99)
Triglycerides: 65 mg/dL (ref 0–149)
VLDL Cholesterol Cal: 13 mg/dL (ref 5–40)

## 2020-11-19 LAB — LDL CHOLESTEROL, DIRECT: LDL Direct: 103 mg/dL — ABNORMAL HIGH (ref 0–99)

## 2020-11-19 LAB — HEMOGLOBIN A1C
Est. average glucose Bld gHb Est-mCnc: 114 mg/dL
Hgb A1c MFr Bld: 5.6 % (ref 4.8–5.6)

## 2020-11-19 MED ORDER — PRAVASTATIN SODIUM 40 MG PO TABS: 40.0000 mg | ORAL_TABLET | Freq: Every day | ORAL | 5 refills | Status: DC

## 2020-11-19 NOTE — Telephone Encounter (Signed)
-----   Message from Ihor Austin, NP sent at 11/19/2020  8:23 AM EDT ----- Please advise patient that recent cholesterol levels did show some improvement on LDL but still remains above goal currently at 103 with a goal of less than 70.  Recommend increasing pravastatin from 20 mg to 40 mg daily and recommend repeat cholesterol levels in 3 months - new rx will be sent to pharmacy as well as order for lab in 3 months.  A1c satisfactory at 5.6.  CMP unremarkable.  Thank you.

## 2020-11-19 NOTE — Telephone Encounter (Signed)
Contacted pt to inform her of labs, LVM per DPR, advising her that recent cholesterol levels did show some improvement on LDL but still remains above goal currently at 103 with a goal of less than 70.  Shanda Bumps recommend increasing pravastatin from 20 mg to 40 mg daily and recommend repeat cholesterol levels in 3 months - new rx sent to pharmacy as well as order for lab in 3 months.  A1c satisfactory at 5.6.  CMP unremarkable. Advised to call the office back with questions.

## 2020-11-24 NOTE — Progress Notes (Signed)
I agree with the above plan 

## 2021-01-01 IMAGING — MR MR MRA NECK WO/W CM
10 of 12 series · 46 of 48 positions shown · IV contrast (gadavist)
Comparison: None.

CLINICAL DATA: Right-sided weakness and numbness.

EXAM:
MR HEAD WITHOUT CONTRAST
MR CIRCLE OF WILLIS WITHOUT CONTRAST
MRA OF THE NECK WITHOUT AND WITH CONTRAST
TECHNIQUE: Multiplanar, multiecho pulse sequences of the brain, circle of
willis and surrounding structures were obtained without intravenous
contrast. Angiographic images of the neck were obtained using MRA
technique without and with intravenous contrast.
CONTRAST:  5.5mL GADAVIST GADOBUTROL 1 MMOL/ML IV SOLN

[Series 10: tof_fl3d_tra_iso · axial · 0.6mm · 0.52mm/px · z∈[-146,-73]mm · 6 of 122 slices shown]
[im 1/122]
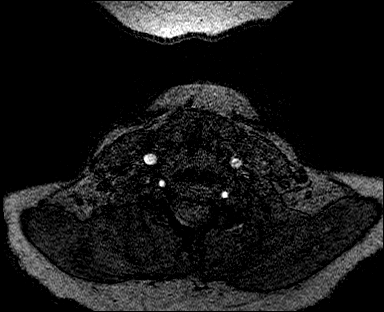
[im 25/122]
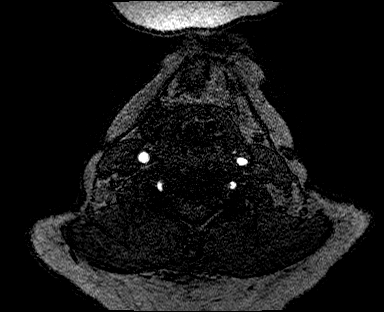
[im 49/122]
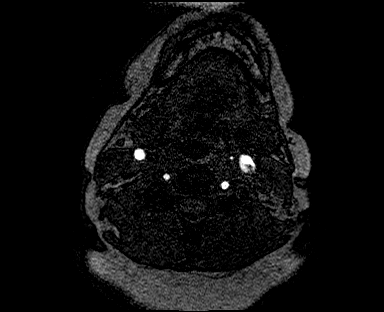
[im 73/122]
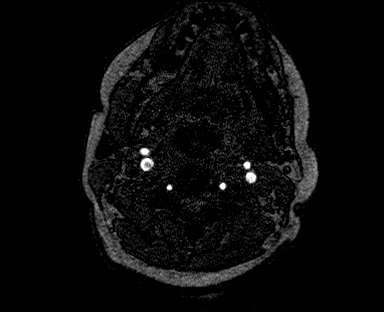
[im 97/122]
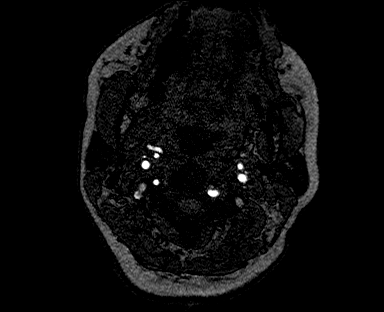
[im 122/122]
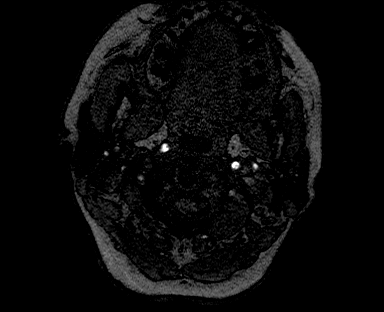

[Series 13: angio_fl3d_cor_pre_ttc=3.0s · coronal · 0.9mm · 0.85mm/px · 4 of 88 slices shown]
[im 1/88]
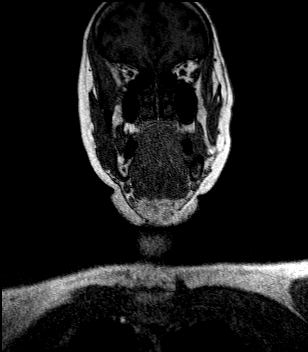
[im 30/88]
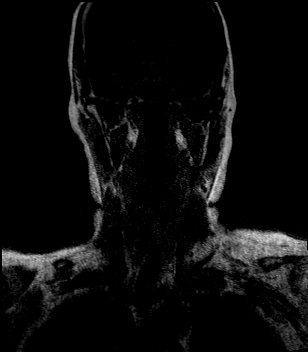
[im 59/88]
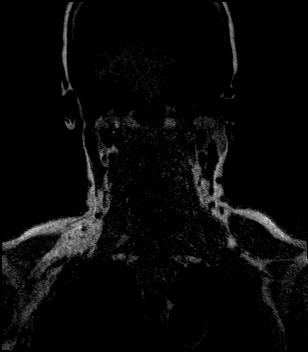
[im 88/88]
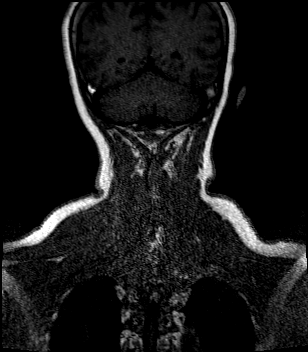

[Series 15: angio_fl3d_cor_post_ttc=3.0s · coronal · 0.9mm · 0.85mm/px · 4 of 88 slices shown (1 of 2)]
[im 1/88]
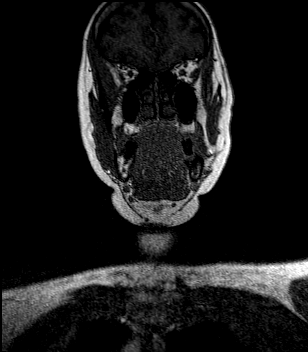
[im 30/88]
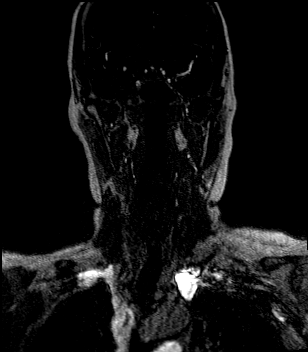
[im 59/88]
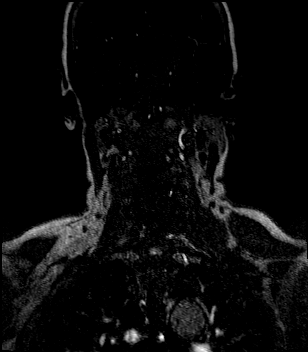
[im 88/88]
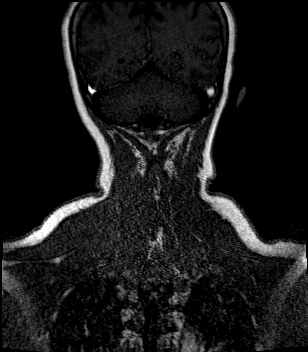

[Series 16: angio_fl3d_cor_post_ttc=3.0s_moco-adv · coronal · 0.9mm · 0.85mm/px · 4 of 88 slices shown (1 of 2)]
[im 1/88]
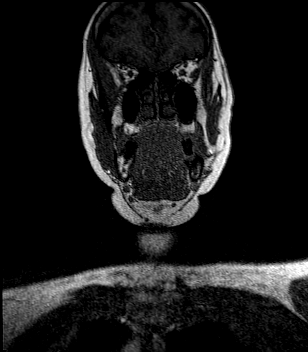
[im 30/88]
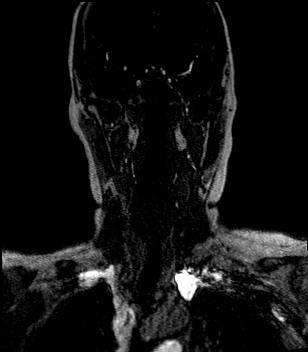
[im 59/88]
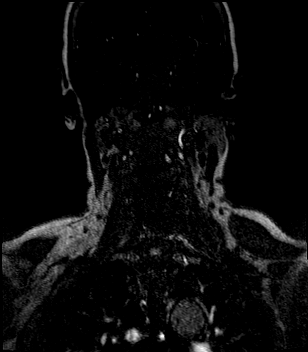
[im 88/88]
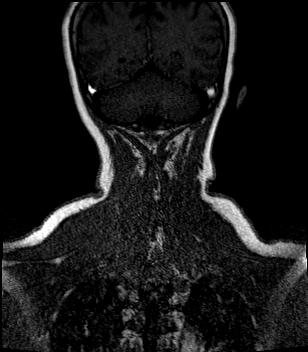

[Series 17: angio_fl3d_cor_post_ttc=3.0s_moco-adv_sub · coronal · 0.9mm · 0.85mm/px · 5 of 88 slices shown (1 of 2)]
[im 1/88]
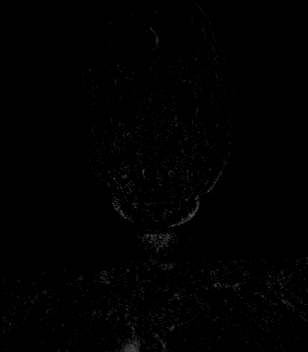
[im 22/88]
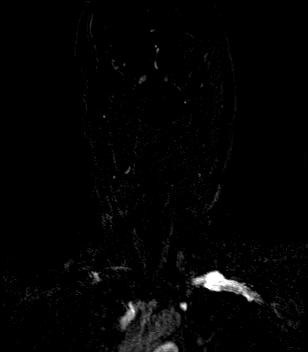
[im 44/88]
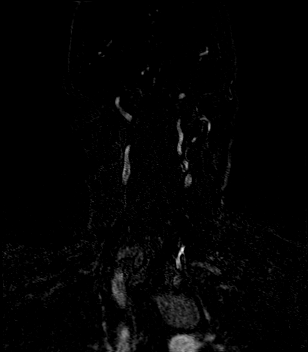
[im 66/88]
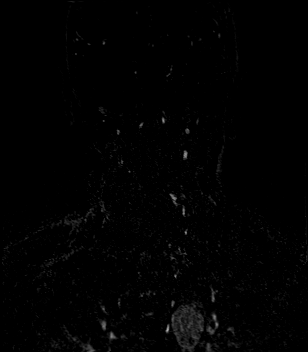
[im 88/88]
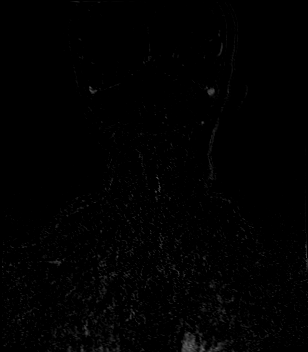

[Series 19: angio_fl3d_cor_post_ttc=3.0s · coronal · 0.9mm · 0.85mm/px · 5 of 88 slices shown (2 of 2)]
[im 1/88]
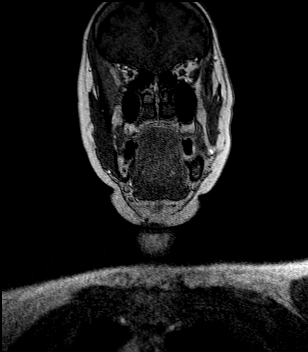
[im 22/88]
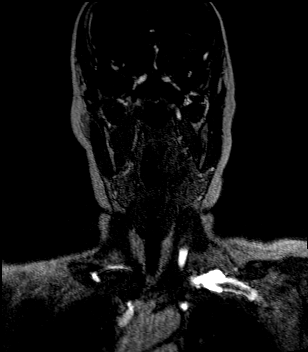
[im 44/88]
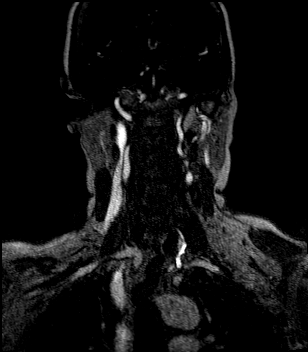
[im 66/88]
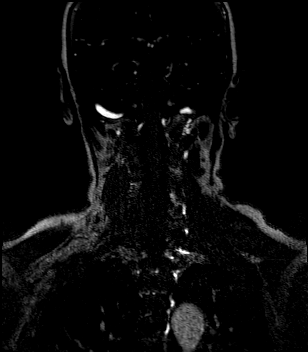
[im 88/88]
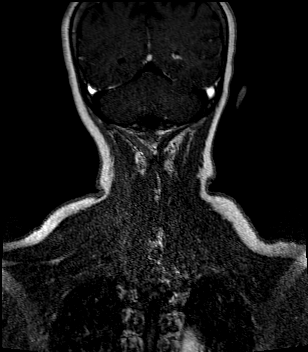

[Series 20: angio_fl3d_cor_post_ttc=3.0s_moco-adv · coronal · 0.9mm · 0.85mm/px · 5 of 88 slices shown (2 of 2)]
[im 1/88]
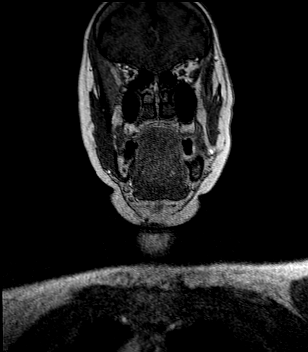
[im 22/88]
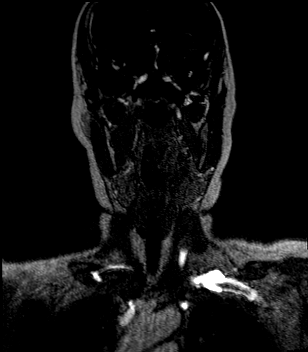
[im 44/88]
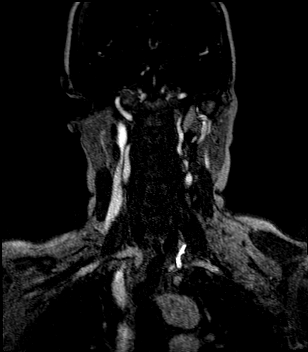
[im 66/88]
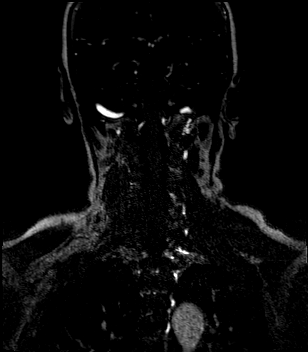
[im 88/88]
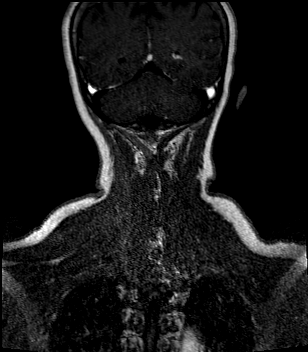

[Series 21: angio_fl3d_cor_post_ttc=3.0s_moco-adv_sub · coronal · 0.9mm · 0.85mm/px · 5 of 87 slices shown (2 of 2)]
[im 1/87]
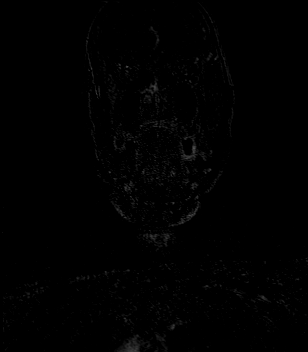
[im 22/87]
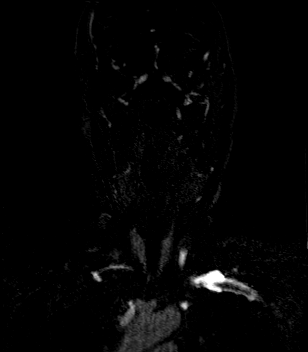
[im 44/87]
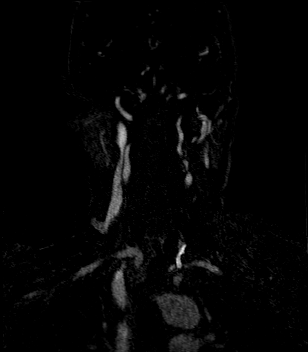
[im 65/87]
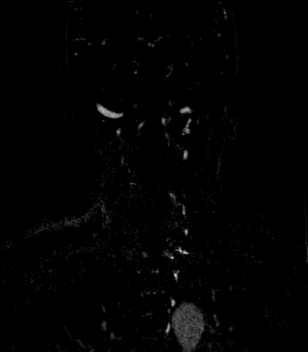
[im 87/87]
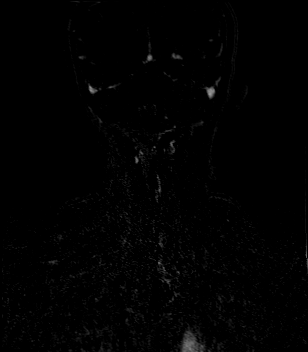

[Series 23: tof_fl2d_tra · axial · 3.0mm · 0.78mm/px · z∈[-241,+8]mm · 7 of 120 slices shown]
[im 1/120]
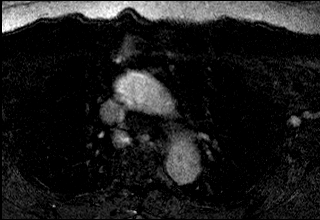
[im 20/120]
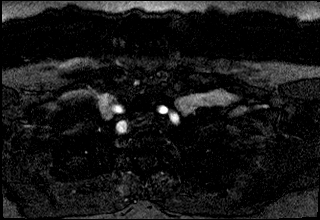
[im 40/120]
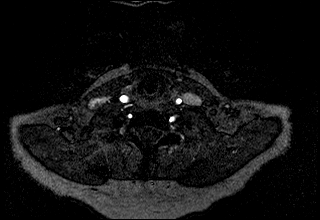
[im 60/120]
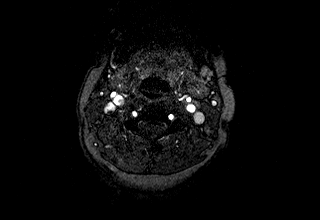
[im 80/120]
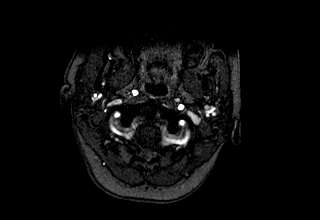
[im 100/120]
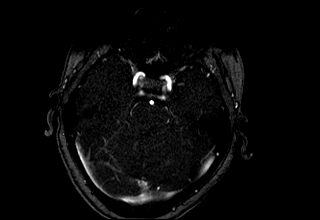
[im 120/120]
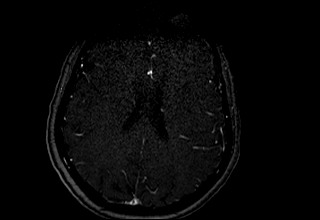

[Series 26: tof_fl2d_tra_mip_tra · axial · 252.9mm · 0.78mm/px · 1 of 1 slices shown]
[im 1/1]
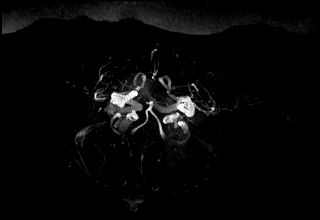

[46 of 48 positions shown; findings below may reference images not displayed]

FINDINGS: MR HEAD FINDINGS

Brain: There is a small acute infarct in the left thalamus.
Multifocal hyperintense T2-weighted signal within the white matter.
Normal volume of CSF spaces. No chronic microhemorrhage. Normal
midline structures.

Vascular: Normal flow voids.

Skull and upper cervical spine: Normal marrow signal.

Sinuses/Orbits: Negative.

Other: None.

MR CIRCLE OF WILLIS FINDINGS

POSTERIOR CIRCULATION:

--Vertebral arteries: Normal V4 segments.

--Inferior cerebellar arteries: Normal.

--Basilar artery: Normal.

--Superior cerebellar arteries: Normal.

--Posterior cerebral arteries: Normal. There are bilateral posterior
communicating arteries (p-comm) that partially supply the PCAs.

ANTERIOR CIRCULATION:

--Intracranial internal carotid arteries: Normal.

--Anterior cerebral arteries (ACA): Normal. Both A1 segments are
present. Patent anterior communicating artery (a-comm).

--Middle cerebral arteries (MCA): Normal.

MRA NECK FINDINGS

Normal carotid and vertebral arteries.
IMPRESSION: 1. Small acute infarct in the left thalamus. No hemorrhage or mass
effect.
2. Normal MRA of the head and neck.

## 2021-05-19 ENCOUNTER — Ambulatory Visit: Payer: PRIVATE HEALTH INSURANCE | Admitting: Adult Health

## 2021-07-01 ENCOUNTER — Ambulatory Visit: Payer: PRIVATE HEALTH INSURANCE | Admitting: Adult Health

## 2021-09-23 ENCOUNTER — Ambulatory Visit: Payer: PRIVATE HEALTH INSURANCE | Admitting: Adult Health

## 2021-11-10 ENCOUNTER — Ambulatory Visit: Payer: PRIVATE HEALTH INSURANCE | Admitting: Adult Health

## 2021-12-29 ENCOUNTER — Encounter: Payer: Self-pay | Admitting: Adult Health

## 2021-12-29 ENCOUNTER — Ambulatory Visit (INDEPENDENT_AMBULATORY_CARE_PROVIDER_SITE_OTHER): Payer: BC Managed Care – PPO | Admitting: Adult Health

## 2021-12-29 VITALS — BP 146/102 | HR 87 | Ht 62.0 in | Wt 114.0 lb

## 2021-12-29 DIAGNOSIS — I6381 Other cerebral infarction due to occlusion or stenosis of small artery: Secondary | ICD-10-CM

## 2021-12-29 DIAGNOSIS — I10 Essential (primary) hypertension: Secondary | ICD-10-CM

## 2021-12-29 DIAGNOSIS — E785 Hyperlipidemia, unspecified: Secondary | ICD-10-CM

## 2021-12-29 MED ORDER — ROSUVASTATIN CALCIUM 10 MG PO TABS: 10.0000 mg | ORAL_TABLET | Freq: Every day | ORAL | 5 refills | Status: AC

## 2021-12-29 NOTE — Patient Instructions (Signed)
Continue aspirin 81 mg daily  and start Crestor 10mg  daily  for secondary stroke prevention  Please follow up with your primary doctor next month to further discuss cholesterol management   Continue to follow up with PCP regarding cholesterol and blood pressure management  Maintain strict control of hypertension with blood pressure goal below 130/90 and cholesterol with LDL cholesterol (bad cholesterol) goal below 70 mg/dL.   Signs of a Stroke? Follow the BEFAST method:  Balance Watch for a sudden loss of balance, trouble with coordination or vertigo Eyes Is there a sudden loss of vision in one or both eyes? Or double vision?  Face: Ask the person to smile. Does one side of the face droop or is it numb?  Arms: Ask the person to raise both arms. Does one arm drift downward? Is there weakness or numbness of a leg? Speech: Ask the person to repeat a simple phrase. Does the speech sound slurred/strange? Is the person confused ? Time: If you observe any of these signs, call 911.        Thank you for coming to see [place] at [facility]. I hope we have been able to provide you high quality care today.  You may receive a patient satisfaction survey over the next few weeks. We would appreciate your feedback and comments so that we may continue to improve ourselves and the health of our patients.

## 2021-12-29 NOTE — Progress Notes (Signed)
[facility] [address] [place]. [place] [address] [phone]       STROKE FOLLOW UP NOTE  [name] Date of Birth:  13-May-1955 Medical Record Number:  [id number]   Reason for Referral: stroke follow up    SUBJECTIVE:   CHIEF COMPLAINT:  Chief Complaint  Patient presents with   Follow-up    Rm 2 alone  Pt is well and stable, no new concerns     HPI:   Update 12/29/2021 JM: Patient returns for stroke follow-up after prior visit for 1 year ago unaccompanied.  Overall stable without new or reoccurring stroke/TIA symptoms. Does still have some mild ring finger and toe numbness which can worsen with cold but overall greatly improved since prior visit, denies continued right shoulder stiffness or leg stiffness.  Remains on aspirin, denies side effects. Stopped pravastatin about 4 months ago due to concern of side effects (?dizziness, myalgias).  Blood pressure elevated today. 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  She routinely is followed by PCP and has follow-up visit next month.    History provided for reference purposes only Update 11/18/2020 JM: Ann Schmidt returns for 36-month stroke follow-up.  Overall stable. Doing much better since prior visit. Occasional stiffness in right shoulder but more towards the end of the day. Able to move right hip and knee much better. Has been doing exercises routinely.  Denies new stroke/TIA symptoms.  Compliant on aspirin 81 mg daily and pravastatin 20mg  daily.  Blood pressure today 123/90. Routinely monitors at home which has been stable. Requesting completion of lab work today.  No further concerns at this time.  Update 05/18/2020 JM: Ann Schmidt returns for stroke follow-up  Reports residual slight right foot and arm heaviness sensation but  overall greatly improved compared to prior visit Denies new stroke/TIA symptoms  Compliant on aspirin 81 mg daily -denies side effects Recent LDL 193 - PCP initiated pravastatin- plans on repeating lipid panel with PCP around June Blood pressure today 142/100 -routinely monitors at home and reports average 110/70-90s  No further concerns at this time  Initial visit 01/08/2020 JM: Ann Schmidt is being seen for hospital follow-up unaccompanied.  She reports residual deficits of right arm numbness/tighness and right toe numbness but overall greatly improving.  She denies residual weakness or imbalance.  She has gradually returned back to her normal activities including work and driving without difficulty.  She unfortunately did not receive home health therapies as her insurance did not cover home health therapies.  She is questioning possibility of outpatient therapy. Denies new or worsening stroke/TIA symptoms.  She remains on aspirin 81 mg daily without bleeding or bruising.  Only able to take Plavix for 3 days due to reports of diarrhea and after stopping, this resolved.  She had difficulty tolerating atorvastatin due to myalgias which resolved after discontinuing.  Blood pressure today 141/94.  She also self discontinued irbesartan due to worsening muscular pain.  She has been using mix of herbal remedies as well as modified diet and increasing exercise and blood pressure has been slowly improving per her report.  No further concerns at this time.  Stroke admission 11/30/2019 Ann Schmidt is a 66 y.o. female with history of hypertension  who presented on 11/30/2019 with right-sided numbness, weakness and difficulty walking.  Personally reviewed hospitalization pertinent progress notes, lab work and imaging with summary provided.  Evaluated by Dr. 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  MRA head/neck unremarkable.  Recommended DAPT for 3 weeks and aspirin  alone.  HTN stable.  LDL 165 and initiate atorvastatin 80 mg daily.  No history of DM with A1c 6.0.  Other stroke risk factors include EtOH use but no prior stroke history.  Evaluated by therapy who recommended [facility] PT/OT for residual imbalance, cognitive deficits, decreased awareness and insight into deficits and right hemisensory impairment.  She was discharged home in stable condition on 12/02/2019.  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    MRI left thalamic infarct MRA head and neck unremarkable 2D Echo EF 60 to 65% LDL 165 HgbA1c 6.0 UDS negative VTE prophylaxis -SCDs No antithrombotic prior to admission, now on aspirin 81 mg daily and clopidogrel 75 mg daily DAPT for 3 weeks and then aspirin alone. Therapy recommendations: HH PT/OT Disposition: home      ROS:   14 system review of systems performed and negative with exception of those listed in HPI  PMH: History reviewed. No pertinent past medical history.  PSH: History reviewed. No pertinent surgical history.  Social History:  Social History   Socioeconomic History   Marital status: Single    Spouse name: Not on file   Number of children: Not on file   Years of education: Not on file   Highest education level: Not on file  Occupational History   Not on file  Tobacco Use   Smoking status: Never   Smokeless tobacco: Never  Substance and Sexual Activity   Alcohol use: Yes    Alcohol/week: 2.0 standard drinks of alcohol    Types: 2 Glasses of wine per week   Drug use: Never   Sexual activity: Not Currently    Birth control/protection: None  Other Topics Concern   Not on file  Social History Narrative   Not on file   Social Determinants of Health   Financial Resource Strain: Not on file  Food Insecurity: Not on file  Transportation Needs: Not on file  Physical Activity: Not on file  Stress: Not on file  Social Connections: Not on file  Intimate  Partner Violence: Not on file    Family History:  Family History  Problem Relation Age of Onset   Diabetes Mother     Medications:   Current Outpatient Medications on File Prior to Visit  Medication Sig Dispense Refill   aspirin EC 81 MG tablet Take 81 mg by mouth daily. Swallow whole.     No current facility-administered medications on file prior to visit.    Allergies:   Allergies  Allergen Reactions   Sulfa Antibiotics Hives   Sulfamethoxazole-Trimethoprim Hives      OBJECTIVE:  Physical Exam  Vitals:   12/29/21 1435 12/29/21 1501  BP: (!) 160/111 (!) 146/102  Pulse: 87   Weight: 114 lb (51.7 kg)   Height: 5\' 2"  (1.575 m)    Body mass index is 20.85 kg/m. No results found.   General: well developed, well nourished, very pleasant middle-aged African-American female, seated, in no evident distress Head: head normocephalic and atraumatic.   Neck: supple with no carotid or supraclavicular bruits Cardiovascular: regular rate and rhythm, no murmurs Musculoskeletal: no deformity Skin:  no rash/petichiae Vascular:  Normal pulses all extremities   Neurologic Exam Mental  Status: Awake and fully alert.   Fluent speech and language.  Oriented to place and time. Recent and remote memory intact. Attention span, concentration and fund of knowledge appropriate. Mood and affect appropriate.  Cranial Nerves: Pupils equal, briskly reactive to light. Extraocular movements full without nystagmus. Visual fields full to confrontation. Hearing intact. Facial sensation intact. Face, tongue, palate moves normally and symmetrically.  Motor: Normal bulk and tone. Normal strength in all tested extremity muscles  Sensory.:  Intact light touch, vibratory and pinprick sensation in all tested extremities Coordination: Rapid alternating movements normal in all extremities. Finger-to-nose and heel-to-shin performed accurately bilaterally. Gait and Station: Arises from chair without  difficulty. Stance is normal. Gait demonstrates normal stride length and balance without use of assistive device.   Reflexes: 1+ and symmetric. Toes downgoing.         ASSESSMENT/PLAN: Ann Schmidt is a 66 y.o. 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 Vascular risk factors include HTN, HLD and EtOH use.     L thalamic stroke :  Recovered very well with minimal residual deficit (subjective mild right hand ring finger and toe numbness) continue aspirin 81 mg daily and start Crestor 10mg  daily for secondary stroke prevention.  Follow-up with PCP next month as scheduled to further discuss cholesterol management Discussed secondary stroke prevention measures and importance of close PCP follow up for aggressive stroke risk factor management including BP goal<130/90, HLD with LDL goal<70 and pre-DM with A1c.<7     Doing well from stroke standpoint without further recommendations and risk factors are managed by PCP. She may follow up PRN, as usual for our patients who are strictly being followed for stroke. If any new neurological issues should arise, request PCP place referral for evaluation by one of our neurologists. Thank you.     CC:  , MD    I spent 31 minutes of face-to-face and non-face-to-face time with patient.  This included previsit chart review, lab review, study review, order entry, electronic health record documentation, patient education regarding prior stroke including etiology, residual deficits, aggressive stroke risk factor management and importance of managing stroke risk factors and answered all other questions to patient satisfaction   Truett Perna, [facility]  [facility] [address] [place], [place]  Phone [phone] Fax [phone] Note: This document was  prepared with digital dictation and possible smart phrase technology. Any transcriptional errors that result from this process are unintentional.

## 2023-02-04 ENCOUNTER — Other Ambulatory Visit: Payer: Self-pay

## 2023-02-04 ENCOUNTER — Encounter (HOSPITAL_BASED_OUTPATIENT_CLINIC_OR_DEPARTMENT_OTHER): Payer: Self-pay | Admitting: Emergency Medicine

## 2023-02-04 ENCOUNTER — Emergency Department (HOSPITAL_BASED_OUTPATIENT_CLINIC_OR_DEPARTMENT_OTHER)
Admission: EM | Admit: 2023-02-04 | Discharge: 2023-02-04 | Disposition: A | Discharge status: Home / Self Care | Payer: Medicare Other | Attending: Emergency Medicine | Admitting: Emergency Medicine

## 2023-02-04 DIAGNOSIS — R040 Epistaxis: Secondary | ICD-10-CM | POA: Insufficient documentation

## 2023-02-04 DIAGNOSIS — I10 Essential (primary) hypertension: Secondary | ICD-10-CM | POA: Diagnosis not present

## 2023-02-04 DIAGNOSIS — Z7982 Long term (current) use of aspirin: Secondary | ICD-10-CM | POA: Insufficient documentation

## 2023-02-04 HISTORY — DX: Essential (primary) hypertension: I10

## 2023-02-04 HISTORY — DX: Cerebral infarction, unspecified: I63.9

## 2023-02-04 MED ORDER — OXYMETAZOLINE HCL 0.05 % NA SOLN
1.0000 | Freq: Once | NASAL | Status: AC
  Administered 2023-02-04: 1 via NASAL
  Filled 2023-02-04: qty 30

## 2023-02-04 NOTE — ED Provider Notes (Signed)
[facility] Provider Note   CSN: [id number] Arrival date & time: 02/04/23  7829     History  Chief Complaint  Patient presents with   Epistaxis    Ganelle Berends is a 67 y.o. female.  HPI       Presents with concern for epistaxis.  Reports last week had some congestion and sore throat which has improved.  No trauma to the nose.  This morning woke up to nosebleed.  Initially it was just coming out of the left nostril, and then she began to pass clots from the left as well as clots from the right and spitting up clots from the back of her throat.  This severe bleeding lasted about 10 minutes.  Discussed with a friend from church and they agree that she needed to go to the emergency department for evaluation.  She was still spitting up some clots from the back of her throat even though the bleed appeared improved.  Does not have a history of nosebleeds in the past.  Does not have history of other easy bleeding or bruising.  Not on anticoagulation   That she has a history of stroke and had been on blood pressure medications in the past that seem to make her symptoms worse and is currently just monitoring her blood pressures at home.  Denies any fever, cough, shortness of breath.    Past Medical History:  Diagnosis Date   Hypertension    Stroke [facility])     Home Medications Prior to Admission medications   Medication Sig Start Date End Date Taking? Authorizing Provider  aspirin EC 81 MG tablet Take 81 mg by mouth daily. Swallow whole.   Yes [provider]  rosuvastatin (CRESTOR) 10 MG tablet Take 1 tablet (10 mg total) by mouth daily. 12/29/21   Ihor Austin, NP      Allergies    Sulfa antibiotics and Sulfamethoxazole-trimethoprim    Review of Systems   Review of Systems  Physical Exam Updated Vital Signs BP (!) 175/134   Pulse (!) 121   Temp 97.7 F (36.5 C) (Oral)   Resp 18   SpO2 100%  Physical Exam Vitals and  nursing note reviewed.  Constitutional:      General: She is not in acute distress.    Appearance: She is well-developed. She is not diaphoretic.  HENT:     Head: Normocephalic and atraumatic.     Nose:     Comments: Left side nares turbinates edema, irritation No active bleeding Eyes:     Conjunctiva/sclera: Conjunctivae normal.  Cardiovascular:     Rate and Rhythm: Normal rate and regular rhythm.     Heart sounds: Normal heart sounds. No murmur heard.    No friction rub. No gallop.  Pulmonary:     Effort: Pulmonary effort is normal. No respiratory distress.     Breath sounds: Normal breath sounds. No wheezing or rales.  Musculoskeletal:     Cervical back: Normal range of motion.  Skin:    General: Skin is warm and dry.     Findings: No erythema or rash.  Neurological:     Mental Status: She is alert and oriented to person, place, and time.     ED Results / Procedures / Treatments   Labs (all labs ordered are listed, but only abnormal results are displayed) Labs Reviewed - No data to display  EKG None  Radiology No results found.  Procedures Procedures  Medications Ordered in ED Medications  oxymetazoline (AFRIN) 0.05 % nasal spray 1 spray (1 spray Each Nare Given 02/04/23 0901)    ED Course/ Medical Decision Making/ A&P                                   67 year old female with a history of hypertension and stroke presents with concern for epistaxis.  Clinically, began in the left nare and suspect it was most likely a left anterior nosebleed.  At time of my evaluation, the bleeding has stopped.  No sign of active bleeding or clots in her nares or the back of her throat.  She is not on anticoagulation, and denies any history of other easy bleeding or bruising.  She recently had a viral infection which may have contributed to this, as well as cold air, or her significant hypertension on arrival.  Gave Afrin to use in the emergency department and discussed  epistaxis care and provided nose clips.  Discussed her hypertension which has decreased now to the 150s systolic.  Do suspect that the degree of hypertension you have on arrival was secondary to her stress in the setting of epistaxis.  Discussed starting her on an antihypertensive, however she has not been able to tolerate amlodipine or labetalol in the past, and at this time her PCP has been monitoring her blood pressures at home in the office.  Recommend continued close monitoring of her blood pressures at home, close PCP follow-up. She states understanding. Patient discharged in stable condition with understanding of reasons to return.     heart score, Chads2Vasc2 etc:1}      Final Clinical Impression(s) / ED Diagnoses Final diagnoses:  Epistaxis    Rx / DC Orders ED Discharge Orders     None         Alvira Monday, MD 02/04/23 1009

## 2023-02-04 NOTE — ED Triage Notes (Signed)
Nose bleed since 0715 this am. Nasal clamp applied

## 2023-04-20 ENCOUNTER — Encounter (HOSPITAL_BASED_OUTPATIENT_CLINIC_OR_DEPARTMENT_OTHER): Payer: Self-pay | Admitting: Radiology

## 2023-04-20 ENCOUNTER — Other Ambulatory Visit: Payer: Self-pay

## 2023-04-20 ENCOUNTER — Emergency Department (HOSPITAL_BASED_OUTPATIENT_CLINIC_OR_DEPARTMENT_OTHER)
Admission: EM | Admit: 2023-04-20 | Discharge: 2023-04-20 | Disposition: A | Discharge status: Home / Self Care | Payer: Medicare Other | Attending: Emergency Medicine | Admitting: Emergency Medicine

## 2023-04-20 DIAGNOSIS — Z8673 Personal history of transient ischemic attack (TIA), and cerebral infarction without residual deficits: Secondary | ICD-10-CM | POA: Insufficient documentation

## 2023-04-20 DIAGNOSIS — I10 Essential (primary) hypertension: Secondary | ICD-10-CM | POA: Diagnosis not present

## 2023-04-20 DIAGNOSIS — R040 Epistaxis: Secondary | ICD-10-CM | POA: Diagnosis present

## 2023-04-20 MED ORDER — SILVER NITRATE-POT NITRATE 75-25 % EX MISC
1.0000 | Freq: Once | CUTANEOUS | Status: AC
  Administered 2023-04-20: 1 via TOPICAL
  Filled 2023-04-20: qty 10

## 2023-04-20 MED ORDER — LIDOCAINE-EPINEPHRINE 2 %-1:100000 IJ SOLN: 20.0000 mL | Freq: Once | INTRAMUSCULAR | Status: DC

## 2023-04-20 MED ORDER — LIDOCAINE-EPINEPHRINE (PF) 2 %-1:200000 IJ SOLN
10.0000 mL | Freq: Once | INTRAMUSCULAR | Status: AC
  Administered 2023-04-20: 10 mL
  Filled 2023-04-20: qty 20

## 2023-04-20 NOTE — ED Triage Notes (Signed)
 Pt states that around 7pm tonight her nose started bleeding excessively. Pt has been to an ENT doctor and was prescribed mupiricon to place in her nose each night for 3 weeks. This is week one after not using the medication and she has done everything to get it to stop but it wont stop. Pt was told that if it started bleeding again they would have to caurterize  the bleed. Pt denies use of blood thinners.

## 2023-04-20 NOTE — Discharge Instructions (Signed)
As we discussed, there are several techniques you can use to prevent or stop nosebleeds in the future.  Keep your nose moist either with saline spray several times a day or by applying a thin layer of Neosporin, bacitracin, or other antibiotic ointment to the inside of your nose once or twice a day.  If the bleeding starts up again, gently blow your nose into a tissue to clear the blood and clots, then apply 1-2 sprays to each affected nostril of over-the-counter Afrin nasal spray (oxymetazoline).   Then squeeze your nose shut tightly and DO NOT PEEK for at least 15 minutes.  This will resolve most nosebleeds.  If you continue to have trouble after trying these techniques, or anything seems out of the ordinary or concerns you, please return tot he Emergency Department.  

## 2023-04-20 NOTE — ED Provider Notes (Signed)
 Emergency Department Provider Note   I have reviewed the triage vital signs and the nursing notes.   HISTORY  Chief Complaint Epistaxis   HPI Ann Schmidt is a 69 y.o. female with PMH of HTN presents to the ED with left side epistaxis today. Symptoms have been persistent despite holding pressure at home. Had a similar issue several weeks back and has completed evaluation with ENT and keep nasal mucosa moist. She is not anticoagulated. No trauma.     Past Medical History:  Diagnosis Date   Hypertension    Stroke [facility])     Review of Systems  Constitutional: No fever/chills ENT: Positive epistaxis.  Cardiovascular: Denies chest pain. Respiratory: Denies shortness of breath. Gastrointestinal: No abdominal pain.   ____________________________________________   PHYSICAL EXAM:  VITAL SIGNS: ED Triage Vitals  Encounter Vitals Group     BP 04/20/23 2101 (!) 190/122     Pulse Rate 04/20/23 2101 97     Resp 04/20/23 2103 20     Temp 04/20/23 2101 98.3 F (36.8 C)     Temp Source 04/20/23 2101 Oral     SpO2 04/20/23 2101 92 %     Weight 04/20/23 2058 109 lb (49.4 kg)     Height 04/20/23 2058 5' 2 (1.575 m)   Constitutional: Alert and oriented. Well appearing and in no acute distress. Eyes: Conjunctivae are normal.  Head: Atraumatic. Nose: Clot adherent to the left nasal septum. This was removed and persistent oozing blood noted.  Mouth/Throat: Mucous membranes are moist.   Neck: No stridor.  Cardiovascular: Good peripheral circulation. Respiratory: Normal respiratory effort.  Gastrointestinal: No distention.  Musculoskeletal: No gross deformities of extremities. Neurologic:  Normal speech and language.  Skin:  Skin is warm, dry and intact. No rash noted.  ____________________________________________   PROCEDURES  Procedure(s) performed:   Epistaxis Management  Date/Time: 04/20/2023 11:39 PM  Performed by: Darra Fonda MATSU, MD Authorized by: Darra Fonda MATSU,  MD   Consent:    Consent obtained:  Verbal   Consent given by:  Patient   Risks, benefits, and alternatives were discussed: yes     Risks discussed:  Bleeding   Alternatives discussed:  No treatment Universal protocol:    Patient identity confirmed:  Verbally with patient Anesthesia:    Anesthesia method:  Topical application   Topical anesthetic:  Lidocaine  gel and epinephrine  Procedure details:    Treatment site:  L anterior   Treatment method:  Silver  nitrate   Treatment complexity:  Limited   Treatment episode: initial   Post-procedure details:    Assessment:  Bleeding stopped   Procedure completion:  Tolerated well, no immediate complications    ____________________________________________   INITIAL IMPRESSION / ASSESSMENT AND PLAN / ED COURSE  Pertinent labs & imaging results that were available during my care of the patient were reviewed by me and considered in my medical decision making (see chart for details).   This patient is Presenting for Evaluation of epistaxis, which does require a range of treatment options, and is a complaint that involves a moderate risk of morbidity and mortality.  The Differential Diagnoses include anterior epistaxis, posterior epistaxis, trauma, thrombocytopenia, etc.  Critical Interventions-    Medications  silver  nitrate applicators applicator 1 Application (has no administration in time range)  lidocaine -EPINEPHrine  (XYLOCAINE  W/EPI) 2 %-1:200000 (PF) injection 10 mL (has no administration in time range)    Reassessment after intervention: symptoms resolved.   Medical Decision Making: Summary:  Patient with anterior epistaxis.  Area of clot and recent bleeding identified. Discussed treatment options with patient and silver  nitrate applied for bedside cauterization as documented above. Patient observed without recurrent bleeding. Patient with resolution of symptoms. No immediate complications. Advised close ENT follow up as an  outpatient.    Patient's presentation is most consistent with acute, uncomplicated illness.   Disposition: discharge  ____________________________________________  FINAL CLINICAL IMPRESSION(S) / ED DIAGNOSES  Final diagnoses:  Left-sided epistaxis    Note:  This document was prepared using Dragon voice recognition software and may include unintentional dictation errors.  Fonda Law, MD, [facility] Emergency Medicine    Marli Diego, Fonda MATSU, MD 04/26/23 1359

## 2023-04-20 NOTE — ED Notes (Signed)
 ENT cart & meds at bedside for EDP. EDP aware.
